# Patient Record
Sex: Female | Born: 1975 | Race: White | Hispanic: No | Marital: Married | Smoking: Never smoker
Health system: Southern US, Community
[De-identification: ages and names within clinical notes are randomized; demographics above are authoritative.]

## PROBLEM LIST (undated history)

## (undated) DIAGNOSIS — T8859XA Other complications of anesthesia, initial encounter: Secondary | ICD-10-CM

## (undated) DIAGNOSIS — F329 Major depressive disorder, single episode, unspecified: Secondary | ICD-10-CM

## (undated) DIAGNOSIS — M87059 Idiopathic aseptic necrosis of unspecified femur: Secondary | ICD-10-CM

## (undated) DIAGNOSIS — F419 Anxiety disorder, unspecified: Secondary | ICD-10-CM

## (undated) DIAGNOSIS — M25552 Pain in left hip: Secondary | ICD-10-CM

## (undated) DIAGNOSIS — E785 Hyperlipidemia, unspecified: Secondary | ICD-10-CM

## (undated) DIAGNOSIS — G43909 Migraine, unspecified, not intractable, without status migrainosus: Secondary | ICD-10-CM

## (undated) DIAGNOSIS — F32A Depression, unspecified: Secondary | ICD-10-CM

## (undated) DIAGNOSIS — G4730 Sleep apnea, unspecified: Secondary | ICD-10-CM

## (undated) DIAGNOSIS — M25551 Pain in right hip: Secondary | ICD-10-CM

## (undated) HISTORY — DX: Sleep apnea, unspecified: G47.30

## (undated) HISTORY — DX: Depression, unspecified: F32.A

## (undated) HISTORY — DX: Major depressive disorder, single episode, unspecified: F32.9

## (undated) HISTORY — DX: Migraine, unspecified, not intractable, without status migrainosus: G43.909

## (undated) HISTORY — DX: Idiopathic aseptic necrosis of unspecified femur: M87.059

## (undated) HISTORY — PX: WISDOM TOOTH EXTRACTION: SHX21

## (undated) HISTORY — DX: Anxiety disorder, unspecified: F41.9

## (undated) HISTORY — DX: Pain in right hip: M25.551

## (undated) HISTORY — DX: Hyperlipidemia, unspecified: E78.5

## (undated) HISTORY — DX: Pain in left hip: M25.552

---

## 2010-11-15 ENCOUNTER — Other Ambulatory Visit: Payer: Self-pay | Admitting: Orthopedic Surgery

## 2010-11-15 DIAGNOSIS — M25552 Pain in left hip: Secondary | ICD-10-CM

## 2010-11-21 ENCOUNTER — Other Ambulatory Visit: Payer: Self-pay

## 2010-11-26 ENCOUNTER — Other Ambulatory Visit: Payer: Self-pay | Admitting: Orthopedic Surgery

## 2010-11-26 ENCOUNTER — Ambulatory Visit
Admission: RE | Admit: 2010-11-26 | Discharge: 2010-11-26 | Disposition: A | Discharge status: Home / Self Care | Payer: BC Managed Care – PPO | Source: Ambulatory Visit | Attending: Orthopedic Surgery | Admitting: Orthopedic Surgery

## 2010-11-26 DIAGNOSIS — M25552 Pain in left hip: Secondary | ICD-10-CM

## 2011-01-24 ENCOUNTER — Encounter: Payer: Self-pay | Admitting: Family Medicine

## 2011-01-24 ENCOUNTER — Ambulatory Visit (INDEPENDENT_AMBULATORY_CARE_PROVIDER_SITE_OTHER): Payer: BC Managed Care – PPO | Admitting: Family Medicine

## 2011-01-24 VITALS — BP 122/70 | HR 109 | Temp 98.9°F | Ht 65.0 in | Wt 280.0 lb

## 2011-01-24 DIAGNOSIS — F329 Major depressive disorder, single episode, unspecified: Secondary | ICD-10-CM

## 2011-01-24 DIAGNOSIS — E785 Hyperlipidemia, unspecified: Secondary | ICD-10-CM

## 2011-01-24 DIAGNOSIS — M25559 Pain in unspecified hip: Secondary | ICD-10-CM

## 2011-01-24 DIAGNOSIS — G4733 Obstructive sleep apnea (adult) (pediatric): Secondary | ICD-10-CM

## 2011-01-24 DIAGNOSIS — F32A Depression, unspecified: Secondary | ICD-10-CM

## 2011-01-24 DIAGNOSIS — E669 Obesity, unspecified: Secondary | ICD-10-CM

## 2011-01-24 DIAGNOSIS — M25551 Pain in right hip: Secondary | ICD-10-CM

## 2011-01-24 MED ORDER — VENLAFAXINE HCL ER 150 MG PO CP24: 150.0000 mg | ORAL_CAPSULE | Freq: Every day | ORAL | Status: DC

## 2011-01-26 ENCOUNTER — Encounter: Payer: Self-pay | Admitting: Family Medicine

## 2011-01-26 NOTE — Progress Notes (Signed)
  Subjective:    Patient ID: Helen Paul, female    DOB: 1976/01/30, 35 y.o.   MRN: [id number]  HPI 8 yr ols female to establish with [place] after moving here from [place], [place] several months ago. She separated from her husband in February, and she moved here to be close to family. Her chief concern today is possible sleep apnea. She asks to be referred for a sleep study. She has snored all her life, and people often comment on how she stops breathing during the night. She has put on around 100 lbs of weight in the past year, and she is always tired and sleepy. Obesity runs in her family, and she watches her diet. Her brother has had lap band surgery and her sister has had a gastric bypass. She is unable to exercise much due to her chronic severe bilateral hiip pain. 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 She is followed by Dr. Madelon Lips, and he has referred her to see Dr. Caswell Corwin at [facility] for possible hip replacements. She will meet him on 02-03-11. She currently works as a Warehouse manager. She had full labs run this past January, with a normal glucose and a normal TSH. Her TG were 239, HDL was 42, and LDL was 105.    Review of Systems  Constitutional: Positive for fatigue.  HENT: Negative.   Eyes: Negative.   Respiratory: Negative.   Cardiovascular: Negative.   Gastrointestinal: Negative.   Musculoskeletal: Positive for arthralgias.  Neurological: Negative.        Objective:   Physical Exam  Constitutional: She is oriented to person, place, and time. She appears well-developed.       obese  Neck: Normal range of motion. Neck supple. No thyromegaly present.  Cardiovascular: Normal rate, regular rhythm, normal heart sounds and intact distal pulses.   Pulmonary/Chest: Effort normal.  Lymphadenopathy:    She has no cervical adenopathy.  Neurological: She is alert and oriented to person, place, and  time. No cranial nerve deficit. She exhibits normal muscle tone. Coordination normal.          Assessment & Plan:  Obviously she needs to lose weight, but exercise [place] difficult for her. She will see Dr. Caswell Corwin about possible hip replacement surgery. Someday bariatric surgery may be appropriate for her. I do think she has obstructive sleep apnea, so we will refer to work this up.

## 2011-02-20 ENCOUNTER — Institutional Professional Consult (permissible substitution): Payer: BC Managed Care – PPO | Admitting: Pulmonary Disease

## 2011-02-21 ENCOUNTER — Ambulatory Visit (INDEPENDENT_AMBULATORY_CARE_PROVIDER_SITE_OTHER): Payer: BC Managed Care – PPO | Admitting: Pulmonary Disease

## 2011-02-21 ENCOUNTER — Encounter: Payer: Self-pay | Admitting: Pulmonary Disease

## 2011-02-21 VITALS — BP 118/70 | HR 80 | Temp 98.3°F | Ht 65.0 in | Wt 274.6 lb

## 2011-02-21 DIAGNOSIS — G4733 Obstructive sleep apnea (adult) (pediatric): Secondary | ICD-10-CM

## 2011-02-21 NOTE — Patient Instructions (Signed)
Will set up for sleep study, and arrange followup once results are available.  

## 2011-02-21 NOTE — Progress Notes (Signed)
  Subjective:    Patient ID: Helen Paul, female    DOB: 04-25-1976, 35 y.o.   MRN: [id number]  HPI The patient is a 35 year old female who I have been asked to see for possible obstructive sleep apnea.  The patient states she has been noted to have loud snoring, but it is unknown if she has an abnormal breathing pattern during sleep.  She denies frequent awakenings during the night, but does not feel rested upon arising.  Although her mother carries a diagnosis of narcolepsy, she has only had issues with this over the last 5 years or so.  She notes significant sleep pressure at work, and is fearful about losing her job.  She also has dozes in the evenings while watching TV or trying to read.  She also admits to sleep pressure with driving longer distances.  Of note, her weight is up about 20 pounds over the last 2 years.  Her Epworth score today is 14.  Sleep Questionnaire: What time do you typically go to bed?( Between what hours) 10:30 to 11:30 pm How long does it take you to fall asleep? usually less than 10 mins How many times during the night do you wake up? 0 What time do you get out of bed to start your day? 0700 Do you drive or operate heavy machinery in your occupation? No How much has your weight changed (up or down) over the past two years? (In pounds) 20 lb (9.072 kg) Have you ever had a sleep study before? No Do you currently use CPAP? No Do you wear oxygen at any time? No    Review of Systems  Constitutional: Negative for fever and unexpected weight change.  HENT: Positive for ear pain and dental problem. Negative for nosebleeds, congestion, sore throat, rhinorrhea, sneezing, trouble swallowing, postnasal drip and sinus pressure.   Eyes: Negative for redness and itching.  Respiratory: Negative for cough, chest tightness, shortness of breath and wheezing.   Cardiovascular: Negative for palpitations and leg swelling.  Gastrointestinal: Negative for nausea and vomiting.  Genitourinary:  Negative for dysuria.  Musculoskeletal: Positive for joint swelling.  Skin: Negative for rash.  Neurological: Positive for headaches.  Hematological: Does not bruise/bleed easily.  Psychiatric/Behavioral: Negative for dysphoric mood. The patient is not nervous/anxious.        Objective:   Physical Exam Constitutional:  Obese female in nad  HENT:  Nares patent without discharge  Oropharynx without exudate, palate mildly elongated, uvula normal, very large tonsils.   Eyes:  Perrla, eomi, no scleral icterus  Neck:  No JVD, no TMG  Cardiovascular:  Normal rate, regular rhythm, no rubs or gallops.  No murmurs        Intact distal pulses  Pulmonary :  Normal breath sounds, no stridor or respiratory distress   No rales, rhonchi, or wheezing  Abdominal:  Soft, nondistended, bowel sounds present.  No tenderness noted.   Musculoskeletal:  No lower extremity edema noted.  Lymph Nodes:  No cervical lymphadenopathy noted  Skin:  No cyanosis noted  Neurologic:  Alert, appropriate, moves all 4 extremities without obvious deficit.         Assessment & Plan:

## 2011-02-23 ENCOUNTER — Encounter: Payer: Self-pay | Admitting: Pulmonary Disease

## 2011-02-23 NOTE — Assessment & Plan Note (Signed)
The patient's history is most consistent with obstructive sleep apnea.  She is obese, has loud snoring, nonrestorative sleep, and significant daytime sleepiness.  I have had a long discussion with the pt about sleep apnea, including its impact on QOL and CV health.  I think at this point that she needs a nocturnal polysomnogram for diagnosis, and patient is agreeable.  If this is unremarkable, she will have to be evaluated for other causes of daytime sleepiness.

## 2011-03-02 ENCOUNTER — Ambulatory Visit (HOSPITAL_BASED_OUTPATIENT_CLINIC_OR_DEPARTMENT_OTHER): Payer: BC Managed Care – PPO | Attending: Pulmonary Disease

## 2011-03-02 DIAGNOSIS — G4733 Obstructive sleep apnea (adult) (pediatric): Secondary | ICD-10-CM | POA: Insufficient documentation

## 2011-03-03 ENCOUNTER — Telehealth: Payer: Self-pay | Admitting: Pulmonary Disease

## 2011-03-03 NOTE — Telephone Encounter (Signed)
Spoke with pt and she states her total hip replacement surgery may be moved up to 9/5 or 9/12 and needs her sleep study read ASAP. Pt states she had this done last night 8/26. I advised pt this could take up to a week to be read but pt states she had already discuss the emergency of this sleep study being read. Pt aware [facility] is not in the office this afternoon. Please advise Dr. Shelle Iron. Thanks  Carver Fila, CMA

## 2011-03-03 NOTE — Telephone Encounter (Signed)
Let her know will look at [facility], and give her a call.

## 2011-03-04 NOTE — Telephone Encounter (Signed)
lmomtcb  

## 2011-03-04 NOTE — Telephone Encounter (Signed)
Pt aware. She also states she wanted it noted her surgery is set for 03-18-11.Carron Curie, CMA

## 2011-03-06 ENCOUNTER — Other Ambulatory Visit: Payer: Self-pay | Admitting: Pulmonary Disease

## 2011-03-06 ENCOUNTER — Telehealth: Payer: Self-pay | Admitting: Pulmonary Disease

## 2011-03-06 DIAGNOSIS — G4733 Obstructive sleep apnea (adult) (pediatric): Secondary | ICD-10-CM

## 2011-03-06 NOTE — Telephone Encounter (Signed)
PT CALLED BACK. "FRUSTRATED" THAT SHE HASN'T YET SPOKEN TO [facility]. THOUGHT SHE WOULD GET RESULTS TODAY. WANTS TO BE CALLED ASAP BY FRI MORNING. Helen Paul

## 2011-03-06 NOTE — Telephone Encounter (Signed)
Helen Paul spoke with pt and is aware of kc response nothing further was needed

## 2011-03-06 NOTE — Procedures (Signed)
NAMESHAMERIA, TRIMARCO              ACCOUNT NO.:  [id number]  MEDICAL RECORD NO.:  [id number]          PATIENT TYPE:  OUT  LOCATION:  SLEEP CENTER                 FACILITY[facility]  [facility]  PHYSICIAN:  Barbaraann Share, MD,FCCPDATE OF BIRTH:  09-12-1975  DATE OF STUDY:  03/02/2011                           NOCTURNAL POLYSOMNOGRAM  REFERRING PHYSICIAN:  Barbaraann Share, MD,FCCP  INDICATION FOR STUDY:  Hypersomnia with sleep apnea.  EPWORTH SLEEPINESS SCORE:  16.  SLEEP ARCHITECTURE:  The patient had a total sleep time of 323 minutes with very little slow wave sleep and only 25 minutes of REM.  Sleep onset latency was normal at 30 minutes, and REM onset was very prolonged at 230 minutes.  Sleep efficiency was mildly reduced at 82%.  RESPIRATORY DATA:  The patient was found to have 9 obstructive apneas, and 125 obstructive hypopneas, giving her an apnea/hypopnea index of 25 events per hour.  The events occurred in all body positions and there was moderate snoring noted throughout.  OXYGEN DATA:  There was O2 desaturation as low as 84% with the patient's obstructive events.  CARDIAC DATA:  No clinically significant arrhythmias were noted.  MOVEMENT-PARASOMNIA:  The patient had no significant leg jerks or other abnormal behaviors.  IMPRESSION-RECOMMENDATIONS:  Moderate obstructive sleep apnea/hypopnea syndrome with an AH I of 25 events per hour, and oxygen desaturation is as low as 84%.  Treatment for this degree of sleep apnea should focus primarily on weight loss as well as CPAP.  A dental appliance could also be considered.     Barbaraann Share, MD,FCCP Diplomate, American Board of Sleep Medicine Electronically Signed    [facility]/MEDQ  D:  03/06/2011 14:50:54  T:  03/06/2011 18:48:03  Job:  [id number]

## 2011-03-06 NOTE — Telephone Encounter (Signed)
PER KATIE- SEND THIS MSG TO DR YOUNG SO THAT HE CAN READ RESULTS AND CALL PT. Helen Paul

## 2011-03-06 NOTE — Telephone Encounter (Signed)
Megan, let pt know she has moderate sleep apnea by her sleep study, and would be best treated with cpap until after her surgery when she hopefully will lose weight.  Have sent order to [facility].

## 2011-03-06 NOTE — Telephone Encounter (Signed)
lmomtcb to advise per [facility] he is going today to read the study

## 2011-03-07 NOTE — Telephone Encounter (Signed)
Order has been faxed to [facility] for cpap set up, pt is aware, Helen Paul is aware.  They were going to set up today, but not convenient for pt.  She will call and speak with DME company to give convenient time for her.  They are aware she will be calling and they said they can setup anytime that is convenient for her, even on weekend or labor day. [name]

## 2011-03-07 NOTE — Telephone Encounter (Signed)
Spoke with pt and she is aware of sleep results and verbalized understanding. Pt wants to know how soon she can get set up with cpap b/c pt surgery is 03/18/11. Order has been sent and I spoke with [facility]) and states she will call pt about the set up of cpap and pt is aware [facility] will call her as well

## 2011-03-11 ENCOUNTER — Encounter (HOSPITAL_BASED_OUTPATIENT_CLINIC_OR_DEPARTMENT_OTHER): Payer: BC Managed Care – PPO

## 2011-03-11 ENCOUNTER — Ambulatory Visit: Payer: BC Managed Care – PPO | Admitting: Pulmonary Disease

## 2011-03-18 HISTORY — PX: JOINT REPLACEMENT: SHX530

## 2011-04-21 ENCOUNTER — Encounter: Payer: Self-pay | Admitting: Family Medicine

## 2011-04-21 ENCOUNTER — Ambulatory Visit (INDEPENDENT_AMBULATORY_CARE_PROVIDER_SITE_OTHER): Payer: BC Managed Care – PPO | Admitting: Family Medicine

## 2011-04-21 VITALS — BP 112/70 | HR 119 | Temp 99.2°F | Wt 265.0 lb

## 2011-04-21 DIAGNOSIS — G4700 Insomnia, unspecified: Secondary | ICD-10-CM

## 2011-04-21 DIAGNOSIS — M25559 Pain in unspecified hip: Secondary | ICD-10-CM

## 2011-04-21 DIAGNOSIS — G4730 Sleep apnea, unspecified: Secondary | ICD-10-CM

## 2011-04-21 MED ORDER — TEMAZEPAM 30 MG PO CAPS: 30.0000 mg | ORAL_CAPSULE | Freq: Every evening | ORAL | Status: DC | PRN

## 2011-04-21 NOTE — Progress Notes (Signed)
  Subjective:    Patient ID: Helen Paul, female    DOB: [birth year], 35 y.o.   MRN: [id number]  HPI Here for help with sleep. Ever since her hip surgery last month she has had trouble falling and staying asleep. She used Temazepam successfully in the past. She can' t relax and can't stop thinking about things. Her surgery went well, and she be going back to work next week.    Review of Systems  Constitutional: Negative.   Musculoskeletal: Positive for arthralgias.  Psychiatric/Behavioral: Positive for sleep disturbance. Negative for dysphoric mood and decreased concentration. The patient is nervous/anxious.        Objective:   Physical Exam  Constitutional: She is oriented to person, place, and time. She appears well-developed and well-nourished.  Neurological: She is alert and oriented to person, place, and time.  Psychiatric: She has a normal mood and affect. Her behavior is normal. Thought content normal.          Assessment & Plan:  Try Temazepam again.

## 2011-05-07 ENCOUNTER — Ambulatory Visit (INDEPENDENT_AMBULATORY_CARE_PROVIDER_SITE_OTHER): Payer: BC Managed Care – PPO

## 2011-05-07 DIAGNOSIS — Z23 Encounter for immunization: Secondary | ICD-10-CM

## 2011-09-12 ENCOUNTER — Ambulatory Visit (INDEPENDENT_AMBULATORY_CARE_PROVIDER_SITE_OTHER): Payer: BC Managed Care – PPO | Admitting: Family Medicine

## 2011-09-12 ENCOUNTER — Encounter: Payer: Self-pay | Admitting: Family Medicine

## 2011-09-12 VITALS — BP 108/74 | HR 95 | Temp 99.1°F | Wt 264.0 lb

## 2011-09-12 DIAGNOSIS — M722 Plantar fascial fibromatosis: Secondary | ICD-10-CM

## 2011-09-12 DIAGNOSIS — G4700 Insomnia, unspecified: Secondary | ICD-10-CM

## 2011-09-12 MED ORDER — TEMAZEPAM 30 MG PO CAPS: 30.0000 mg | ORAL_CAPSULE | Freq: Every evening | ORAL | Status: DC | PRN

## 2011-09-12 NOTE — Progress Notes (Signed)
  Subjective:    Patient ID: Helen Paul, female    DOB: 1975-11-18, 36 y.o.   MRN: [id number]  HPI Here for 2 months of left heel pain. No trauma hx. She started doing Zumba classes recently to lose weight.    Review of Systems  Constitutional: Negative.        Objective:   Physical Exam  Constitutional: She appears well-developed and well-nourished.  Musculoskeletal:       Tender over the bottom of the left heel. No redness or swelling.           Assessment & Plan:  Use Motrin for pain. Wear gel arch supports and good supportive shoes at all times. Recheck prn

## 2011-10-06 ENCOUNTER — Encounter: Payer: Self-pay | Admitting: Family

## 2011-10-06 ENCOUNTER — Ambulatory Visit (INDEPENDENT_AMBULATORY_CARE_PROVIDER_SITE_OTHER): Payer: BC Managed Care – PPO | Admitting: Family

## 2011-10-06 VITALS — BP 120/76 | Temp 98.8°F | Wt 269.0 lb

## 2011-10-06 DIAGNOSIS — R05 Cough: Secondary | ICD-10-CM

## 2011-10-06 DIAGNOSIS — R059 Cough, unspecified: Secondary | ICD-10-CM

## 2011-10-06 DIAGNOSIS — J209 Acute bronchitis, unspecified: Secondary | ICD-10-CM

## 2011-10-06 MED ORDER — METHYLPREDNISOLONE 4 MG PO KIT: PACK | ORAL | Status: DC

## 2011-10-06 MED ORDER — METHYLPREDNISOLONE 4 MG PO KIT: PACK | ORAL | Status: AC

## 2011-10-06 MED ORDER — AMOXICILLIN 500 MG PO TABS: 1000.0000 mg | ORAL_TABLET | Freq: Two times a day (BID) | ORAL | Status: AC

## 2011-10-06 MED ORDER — AMOXICILLIN 500 MG PO TABS: 1000.0000 mg | ORAL_TABLET | Freq: Two times a day (BID) | ORAL | Status: DC

## 2011-10-06 NOTE — Patient Instructions (Signed)

## 2011-10-06 NOTE — Progress Notes (Signed)
Addended by: Beverely Low on: 10/06/2011 04:09 PM   Modules accepted: Orders

## 2011-10-06 NOTE — Progress Notes (Signed)
Subjective:    Patient ID: Helen Paul, female    DOB: 1975-11-23, 36 y.o.   MRN: [id number]  HPI Comments: C/o sinus drainage, chills, sorethroat, hacking cough, dyspnea with cough, and nasal drainage x 5 days. Took OTC theraflu with no relief.   Sinusitis Associated symptoms include chills, congestion, ear pain, shortness of breath and sinus pressure. Pertinent negatives include no diaphoresis or sneezing.      Review of Systems  Constitutional: Positive for fever and chills. Negative for diaphoresis, activity change and fatigue.  HENT: Positive for ear pain, congestion, rhinorrhea, postnasal drip and sinus pressure. Negative for hearing loss, facial swelling, sneezing, neck stiffness and ear discharge.   Respiratory: Positive for shortness of breath. Negative for apnea, choking and chest tightness.   Cardiovascular: Negative.    Past Medical History  Diagnosis Date  . Depression   . Migraines   . Hyperlipidemia   . Arthritis   . Hip pain, bilateral     sees Dr. Madelon Lips  . Avascular necrosis of hip   . Sleep apnea     per Dr. Shelle Iron, uses CPAP     History   Social History  . Marital Status: Single    Spouse Name: N/A    Number of Children: N/A  . Years of Education: N/A   Occupational History  . Not on file.   Social History Main Topics  . Smoking status: Never Smoker   . Smokeless tobacco: Never Used  . Alcohol Use: No  . Drug Use: No  . Sexually Active: Not on file   Other Topics Concern  . Not on file   Social History Narrative  . No narrative on file    Past Surgical History  Procedure Date  . Wisdom tooth extraction   . Joint replacement 03-18-11    left total hip, per Dr. Nancy Fetter at [facility] History  Problem Relation Age of Onset  . Fibromyalgia Mother   . Heart disease Father   . Hypertension Father   . Diabetes Father   . Heart failure Father   . Chronic fatigue Father   . Narcolepsy Father   . Obesity Sister   .  Obesity Brother     Allergies  Allergen Reactions  . Hydrocodone   . Zithromax (Azithromycin Dihydrate)     Current Outpatient Prescriptions on File Prior to Visit  Medication Sig Dispense Refill  . Multiple Vitamin (MULTIVITAMIN) tablet Take 1 tablet by mouth daily.      . temazepam (RESTORIL) 30 MG capsule Take 1 capsule (30 mg total) by mouth at bedtime as needed for sleep.  30 capsule  5  . venlafaxine (EFFEXOR-XR) 150 MG 24 hr capsule Take 1 capsule (150 mg total) by mouth daily.  90 capsule  3    BP 120/76  Temp(Src) 98.8 F (37.1 C) (Oral)  Wt 269 lb (122.018 kg)chart    Objective:   Physical Exam  Constitutional: She is oriented to person, place, and time. She appears well-developed and well-nourished. No distress.  HENT:  Right Ear: External ear normal.  Left Ear: External ear normal.  Mouth/Throat: Oropharyngeal exudate present.  Cardiovascular: Normal rate, regular rhythm, normal heart sounds and intact distal pulses.  Exam reveals no gallop and no friction rub.   No murmur heard. Pulmonary/Chest: Effort normal and breath sounds normal. No respiratory distress. She has no wheezes. She has no rales. She exhibits no tenderness.  Neurological: She is alert and oriented to  person, place, and time.  Skin: Skin is warm and dry. She is not diaphoretic.          Assessment & Plan:  Assessment: Bronchitis Plan: Medrol dose pack, amoxicillin, teaching handouts provided on diagnosis and treatment, encouraged to RTC if s/s get worse

## 2012-02-14 ENCOUNTER — Other Ambulatory Visit: Payer: Self-pay | Admitting: Family Medicine

## 2012-02-17 ENCOUNTER — Encounter: Payer: Self-pay | Admitting: Family Medicine

## 2012-02-17 ENCOUNTER — Ambulatory Visit (INDEPENDENT_AMBULATORY_CARE_PROVIDER_SITE_OTHER): Payer: BC Managed Care – PPO | Admitting: Family Medicine

## 2012-02-17 VITALS — BP 140/90 | Temp 98.6°F | Wt 262.0 lb

## 2012-02-17 DIAGNOSIS — F418 Other specified anxiety disorders: Secondary | ICD-10-CM

## 2012-02-17 DIAGNOSIS — F341 Dysthymic disorder: Secondary | ICD-10-CM

## 2012-02-17 MED ORDER — LAMOTRIGINE 25 MG PO TABS: 50.0000 mg | ORAL_TABLET | Freq: Every day | ORAL | Status: DC

## 2012-02-17 MED ORDER — ALPRAZOLAM 1 MG PO TABS: 1.0000 mg | ORAL_TABLET | Freq: Two times a day (BID) | ORAL | Status: AC | PRN

## 2012-02-17 NOTE — Progress Notes (Signed)
  Subjective:    Patient ID: Helen Paul, female    DOB: 10/13/1975, 36 y.o.   MRN: [id number]  HPI Here to discuss anxiety and depression. She has been on Effexor for about 5 years and has been satisfied for the most part. However she has had a difficult 6 months. Her divorce became final in March, but she has still been in contact with her ex-husband. He even asked her about getting back together a month ago and they talked or texted frequently. He had proposed a meeting but then backed out suddenly and now will not return her calls. She is quite depressed now and cries frequently. She gets very anxious and can't focus on anything. It is hard to sleep. She denies any suicidal thoughts.    Review of Systems  Constitutional: Negative.   Psychiatric/Behavioral: Positive for disturbed wake/sleep cycle, dysphoric mood, decreased concentration and agitation. Negative for suicidal ideas, hallucinations, behavioral problems, confusion and self-injury. The patient is nervous/anxious.        Objective:   Physical Exam  Constitutional: She appears well-developed and well-nourished.  Psychiatric: Her behavior is normal. Thought content normal.       Anxious, tearful          Assessment & Plan:  Continue Effexor. Add Lamictal 50 mg a day and Xanax to use prn. Recheck in 2 week s

## 2012-02-24 ENCOUNTER — Ambulatory Visit (INDEPENDENT_AMBULATORY_CARE_PROVIDER_SITE_OTHER): Payer: BC Managed Care – PPO | Admitting: Licensed Clinical Social Worker

## 2012-02-24 DIAGNOSIS — F411 Generalized anxiety disorder: Secondary | ICD-10-CM

## 2012-02-24 DIAGNOSIS — F331 Major depressive disorder, recurrent, moderate: Secondary | ICD-10-CM

## 2012-03-02 ENCOUNTER — Ambulatory Visit: Payer: BC Managed Care – PPO | Admitting: Family Medicine

## 2012-03-04 ENCOUNTER — Ambulatory Visit (INDEPENDENT_AMBULATORY_CARE_PROVIDER_SITE_OTHER): Payer: BC Managed Care – PPO | Admitting: Licensed Clinical Social Worker

## 2012-03-04 DIAGNOSIS — F411 Generalized anxiety disorder: Secondary | ICD-10-CM

## 2012-03-04 DIAGNOSIS — F331 Major depressive disorder, recurrent, moderate: Secondary | ICD-10-CM

## 2012-03-05 ENCOUNTER — Ambulatory Visit: Payer: BC Managed Care – PPO | Admitting: Licensed Clinical Social Worker

## 2012-03-10 ENCOUNTER — Encounter: Payer: Self-pay | Admitting: Family Medicine

## 2012-03-10 ENCOUNTER — Ambulatory Visit (INDEPENDENT_AMBULATORY_CARE_PROVIDER_SITE_OTHER): Payer: BC Managed Care – PPO | Admitting: Family Medicine

## 2012-03-10 VITALS — BP 130/84 | HR 90 | Temp 98.9°F | Wt 260.0 lb

## 2012-03-10 DIAGNOSIS — F341 Dysthymic disorder: Secondary | ICD-10-CM

## 2012-03-10 DIAGNOSIS — F418 Other specified anxiety disorders: Secondary | ICD-10-CM

## 2012-03-10 MED ORDER — LAMOTRIGINE 100 MG PO TABS: 100.0000 mg | ORAL_TABLET | Freq: Every day | ORAL | Status: DC

## 2012-03-10 NOTE — Progress Notes (Signed)
  Subjective:    Patient ID: Helen Paul, female    DOB: February 15, 1976, 36 y.o.   MRN: [id number]  HPI Here to follow up on anxiety and OCD traits. She is still trying to work things out with her ex-husband but this is not going well. She is pleased with her sessions with Judithe Modest and finds this very helpful. She thinks the Lamictal is helping but it could work a little better. No side effects. She is working out at [employer] 4 days a week.   Review of Systems  Constitutional: Negative.   Psychiatric/Behavioral: Positive for dysphoric mood and decreased concentration. Negative for behavioral problems, confusion and agitation. The patient is nervous/anxious.        Objective:   Physical Exam  Constitutional: She is oriented to person, place, and time. She appears well-developed and well-nourished.  Neurological: She is alert and oriented to person, place, and time.  Psychiatric: She has a normal mood and affect. Her behavior is normal. Thought content normal.          Assessment & Plan:  She is clearly doing better, but we will increase the Lamictal to 100 mg a day. Continue with therapy. Recheck one month.

## 2012-03-12 ENCOUNTER — Ambulatory Visit: Payer: BC Managed Care – PPO | Admitting: Licensed Clinical Social Worker

## 2012-03-15 ENCOUNTER — Ambulatory Visit (INDEPENDENT_AMBULATORY_CARE_PROVIDER_SITE_OTHER): Payer: BC Managed Care – PPO | Admitting: Licensed Clinical Social Worker

## 2012-03-15 DIAGNOSIS — F331 Major depressive disorder, recurrent, moderate: Secondary | ICD-10-CM

## 2012-03-15 DIAGNOSIS — F411 Generalized anxiety disorder: Secondary | ICD-10-CM

## 2012-03-19 ENCOUNTER — Ambulatory Visit: Payer: BC Managed Care – PPO | Admitting: Licensed Clinical Social Worker

## 2012-03-22 ENCOUNTER — Ambulatory Visit (INDEPENDENT_AMBULATORY_CARE_PROVIDER_SITE_OTHER): Payer: BC Managed Care – PPO | Admitting: Licensed Clinical Social Worker

## 2012-03-22 DIAGNOSIS — F411 Generalized anxiety disorder: Secondary | ICD-10-CM

## 2012-03-22 DIAGNOSIS — F331 Major depressive disorder, recurrent, moderate: Secondary | ICD-10-CM

## 2012-03-24 ENCOUNTER — Telehealth: Payer: Self-pay | Admitting: Family Medicine

## 2012-03-24 NOTE — Telephone Encounter (Signed)
Refill request for Temazepam 30 mg take 1 po qhs prn and last here on 03/10/12. 

## 2012-03-24 NOTE — Telephone Encounter (Signed)
Call in #30 with 5 rf 

## 2012-03-26 ENCOUNTER — Telehealth: Payer: Self-pay | Admitting: Family Medicine

## 2012-03-26 MED ORDER — TEMAZEPAM 30 MG PO CAPS: 30.0000 mg | ORAL_CAPSULE | Freq: Every evening | ORAL | Status: DC | PRN

## 2012-03-26 NOTE — Telephone Encounter (Signed)
I called in script 

## 2012-03-26 NOTE — Telephone Encounter (Signed)
Refill request for Temazepam 30 mg take 1 po qhs prn and last here on 03/10/12.

## 2012-03-26 NOTE — Telephone Encounter (Signed)
duplicate

## 2012-03-29 ENCOUNTER — Ambulatory Visit (INDEPENDENT_AMBULATORY_CARE_PROVIDER_SITE_OTHER): Payer: BC Managed Care – PPO | Admitting: Licensed Clinical Social Worker

## 2012-03-29 DIAGNOSIS — F411 Generalized anxiety disorder: Secondary | ICD-10-CM

## 2012-03-29 DIAGNOSIS — F331 Major depressive disorder, recurrent, moderate: Secondary | ICD-10-CM

## 2012-04-05 ENCOUNTER — Ambulatory Visit (INDEPENDENT_AMBULATORY_CARE_PROVIDER_SITE_OTHER): Payer: BC Managed Care – PPO | Admitting: Licensed Clinical Social Worker

## 2012-04-05 DIAGNOSIS — F331 Major depressive disorder, recurrent, moderate: Secondary | ICD-10-CM

## 2012-04-05 DIAGNOSIS — F411 Generalized anxiety disorder: Secondary | ICD-10-CM

## 2012-04-13 ENCOUNTER — Ambulatory Visit (INDEPENDENT_AMBULATORY_CARE_PROVIDER_SITE_OTHER): Payer: BC Managed Care – PPO | Admitting: Licensed Clinical Social Worker

## 2012-04-13 DIAGNOSIS — F331 Major depressive disorder, recurrent, moderate: Secondary | ICD-10-CM

## 2012-04-13 DIAGNOSIS — F411 Generalized anxiety disorder: Secondary | ICD-10-CM

## 2012-04-26 ENCOUNTER — Ambulatory Visit (INDEPENDENT_AMBULATORY_CARE_PROVIDER_SITE_OTHER): Payer: BC Managed Care – PPO | Admitting: Licensed Clinical Social Worker

## 2012-04-26 DIAGNOSIS — F331 Major depressive disorder, recurrent, moderate: Secondary | ICD-10-CM

## 2012-04-26 DIAGNOSIS — F411 Generalized anxiety disorder: Secondary | ICD-10-CM

## 2012-05-10 ENCOUNTER — Ambulatory Visit (INDEPENDENT_AMBULATORY_CARE_PROVIDER_SITE_OTHER): Payer: BC Managed Care – PPO | Admitting: Licensed Clinical Social Worker

## 2012-05-10 DIAGNOSIS — F411 Generalized anxiety disorder: Secondary | ICD-10-CM

## 2012-05-10 DIAGNOSIS — F331 Major depressive disorder, recurrent, moderate: Secondary | ICD-10-CM

## 2012-05-11 ENCOUNTER — Other Ambulatory Visit: Payer: Self-pay | Admitting: Family Medicine

## 2012-05-11 NOTE — Telephone Encounter (Signed)
Call in #60 with 2 rf 

## 2012-05-24 ENCOUNTER — Ambulatory Visit: Payer: BC Managed Care – PPO | Admitting: Licensed Clinical Social Worker

## 2012-06-11 ENCOUNTER — Other Ambulatory Visit: Payer: Self-pay | Admitting: Family Medicine

## 2012-08-04 ENCOUNTER — Telehealth: Payer: Self-pay | Admitting: Family Medicine

## 2012-08-04 MED ORDER — VENLAFAXINE HCL ER 150 MG PO CP24: 150.0000 mg | ORAL_CAPSULE | Freq: Every day | ORAL | Status: DC

## 2012-08-04 NOTE — Telephone Encounter (Signed)
Call in #30 as above

## 2012-08-04 NOTE — Telephone Encounter (Signed)
Patient called stating that she need a 30 day refill of her efexxor xr 150mg  1poqd called into [facility] [address] until she can get her mail order rx. Please assist.

## 2012-08-04 NOTE — Telephone Encounter (Signed)
Rx sent to pharmacy   

## 2012-08-12 ENCOUNTER — Telehealth: Payer: Self-pay | Admitting: Family Medicine

## 2012-08-12 ENCOUNTER — Ambulatory Visit (INDEPENDENT_AMBULATORY_CARE_PROVIDER_SITE_OTHER): Payer: BC Managed Care – PPO | Admitting: Licensed Clinical Social Worker

## 2012-08-12 DIAGNOSIS — F331 Major depressive disorder, recurrent, moderate: Secondary | ICD-10-CM

## 2012-08-12 DIAGNOSIS — F411 Generalized anxiety disorder: Secondary | ICD-10-CM

## 2012-08-12 NOTE — Telephone Encounter (Signed)
Pt needs a refill of 90 day Effexor XR 150 [facility], also if you can do Lamotrigine in a 90 day supply as well, pt is not sure if this is possible, also [facility]

## 2012-08-13 NOTE — Telephone Encounter (Signed)
Call in 90 day supplies of both with 3 rf

## 2012-08-16 MED ORDER — LAMOTRIGINE 100 MG PO TABS: 100.0000 mg | ORAL_TABLET | Freq: Every day | ORAL | Status: DC

## 2012-08-16 MED ORDER — VENLAFAXINE HCL ER 150 MG PO CP24: 150.0000 mg | ORAL_CAPSULE | Freq: Every day | ORAL | Status: DC

## 2012-08-16 NOTE — Telephone Encounter (Signed)
I sent both scripts e-scribe and tried to reach pt, no answer.

## 2012-08-31 ENCOUNTER — Other Ambulatory Visit: Payer: BC Managed Care – PPO

## 2012-09-01 ENCOUNTER — Other Ambulatory Visit (INDEPENDENT_AMBULATORY_CARE_PROVIDER_SITE_OTHER): Payer: BC Managed Care – PPO

## 2012-09-01 DIAGNOSIS — Z0000 Encounter for general adult medical examination without abnormal findings: Secondary | ICD-10-CM

## 2012-09-01 LAB — CBC WITH DIFFERENTIAL/PLATELET
Basophils Absolute: 0 10*3/uL (ref 0.0–0.1)
Basophils Relative: 0.4 % (ref 0.0–3.0)
Eosinophils Absolute: 0.1 10*3/uL (ref 0.0–0.7)
Eosinophils Relative: 1.4 % (ref 0.0–5.0)
HCT: 37.2 % (ref 36.0–46.0)
Hemoglobin: 12.5 g/dL (ref 12.0–15.0)
Lymphocytes Relative: 28 % (ref 12.0–46.0)
Lymphs Abs: 2 10*3/uL (ref 0.7–4.0)
MCHC: 33.7 g/dL (ref 30.0–36.0)
MCV: 84.2 fl (ref 78.0–100.0)
Monocytes Absolute: 0.4 10*3/uL (ref 0.1–1.0)
Monocytes Relative: 5.3 % (ref 3.0–12.0)
Neutro Abs: 4.6 10*3/uL (ref 1.4–7.7)
Neutrophils Relative %: 64.9 % (ref 43.0–77.0)
Platelets: 350 10*3/uL (ref 150.0–400.0)
RBC: 4.42 Mil/uL (ref 3.87–5.11)
RDW: 14.8 % — ABNORMAL HIGH (ref 11.5–14.6)
WBC: 7 10*3/uL (ref 4.5–10.5)

## 2012-09-01 LAB — POCT URINALYSIS DIPSTICK
Blood, UA: NEGATIVE
Glucose, UA: NEGATIVE
Leukocytes, UA: NEGATIVE
Nitrite, UA: NEGATIVE
Spec Grav, UA: >=1.03
Urobilinogen, UA: 0.2
pH, UA: 5.5

## 2012-09-01 LAB — HEPATIC FUNCTION PANEL
ALT: 24 U/L (ref 0–35)
AST: 25 U/L (ref 0–37)
Albumin: 3.9 g/dL (ref 3.5–5.2)
Alkaline Phosphatase: 79 U/L (ref 39–117)
Bilirubin, Direct: 0 mg/dL (ref 0.0–0.3)
Total Bilirubin: 0.4 mg/dL (ref 0.3–1.2)
Total Protein: 7.1 g/dL (ref 6.0–8.3)

## 2012-09-01 LAB — LIPID PANEL
Cholesterol: 222 mg/dL — ABNORMAL HIGH (ref 0–200)
HDL: 44.9 mg/dL (ref >39.00)
Total CHOL/HDL Ratio: 5
Triglycerides: 149 mg/dL (ref 0.0–149.0)
VLDL: 29.8 mg/dL (ref 0.0–40.0)

## 2012-09-01 LAB — BASIC METABOLIC PANEL
BUN: 15 mg/dL (ref 6–23)
CO2: 27 mEq/L (ref 19–32)
Calcium: 9.2 mg/dL (ref 8.4–10.5)
Chloride: 102 mEq/L (ref 96–112)
Creatinine, Ser: 0.8 mg/dL (ref 0.4–1.2)
GFR: 82.25 mL/min (ref >60.00)
Glucose, Bld: 89 mg/dL (ref 70–99)
Potassium: 3.9 mEq/L (ref 3.5–5.1)
Sodium: 138 mEq/L (ref 135–145)

## 2012-09-01 LAB — LDL CHOLESTEROL, DIRECT: Direct LDL: 168.3 mg/dL

## 2012-09-01 LAB — TSH: TSH: 1.73 u[IU]/mL (ref 0.35–5.50)

## 2012-09-02 NOTE — Progress Notes (Signed)
Quick Note:  Pt has CPE on 09/07/12 will go over then. ______ 

## 2012-09-04 ENCOUNTER — Other Ambulatory Visit: Payer: Self-pay | Admitting: Family Medicine

## 2012-09-07 ENCOUNTER — Ambulatory Visit (INDEPENDENT_AMBULATORY_CARE_PROVIDER_SITE_OTHER): Payer: BC Managed Care – PPO | Admitting: Family Medicine

## 2012-09-07 ENCOUNTER — Encounter: Payer: Self-pay | Admitting: Family Medicine

## 2012-09-07 VITALS — BP 128/78 | HR 80 | Temp 98.7°F | Ht 65.5 in | Wt 250.0 lb

## 2012-09-07 DIAGNOSIS — F32A Depression, unspecified: Secondary | ICD-10-CM

## 2012-09-07 DIAGNOSIS — F329 Major depressive disorder, single episode, unspecified: Secondary | ICD-10-CM

## 2012-09-07 DIAGNOSIS — Z0000 Encounter for general adult medical examination without abnormal findings: Secondary | ICD-10-CM

## 2012-09-07 MED ORDER — TEMAZEPAM 30 MG PO CAPS: 30.0000 mg | ORAL_CAPSULE | Freq: Every evening | ORAL | Status: DC | PRN

## 2012-09-07 NOTE — Progress Notes (Signed)
  Subjective:    Patient ID: Helen Paul, female    DOB: 26-Jan-1976, 37 y.o.   MRN: [id number]  HPI 37 yr old female for a cpx. She is doing well and has no concerns. She had been involved with [employer] and was exercising last fall and she lost 29 lbs. Over the winter she has stopped some of this with her weight being stable.    Review of Systems  Constitutional: Negative.   HENT: Negative.   Eyes: Negative.   Respiratory: Negative.   Cardiovascular: Negative.   Gastrointestinal: Negative.   Genitourinary: Negative for dysuria, urgency, frequency, hematuria, flank pain, decreased urine volume, enuresis, difficulty urinating, pelvic pain and dyspareunia.  Musculoskeletal: Negative.   Skin: Negative.   Neurological: Negative.   Psychiatric/Behavioral: Negative.        Objective:   Physical Exam  Constitutional: She is oriented to person, place, and time. She appears well-developed and well-nourished. No distress.  HENT:  Head: Normocephalic and atraumatic.  Right Ear: External ear normal.  Left Ear: External ear normal.  Nose: Nose normal.  Mouth/Throat: Oropharynx is clear and moist. No oropharyngeal exudate.  Eyes: Conjunctivae and EOM are normal. Pupils are equal, round, and reactive to light. No scleral icterus.  Neck: Normal range of motion. Neck supple. No JVD present. No thyromegaly present.  Cardiovascular: Normal rate, regular rhythm, normal heart sounds and intact distal pulses.  Exam reveals no gallop and no friction rub.   No murmur heard. Pulmonary/Chest: Effort normal and breath sounds normal. No respiratory distress. She has no wheezes. She has no rales. She exhibits no tenderness.  Abdominal: Soft. Bowel sounds are normal. She exhibits no distension and no mass. There is no tenderness. There is no rebound and no guarding.  Musculoskeletal: Normal range of motion. She exhibits no edema and no tenderness.  Lymphadenopathy:    She has no cervical adenopathy.   Neurological: She is alert and oriented to person, place, and time. She has normal reflexes. No cranial nerve deficit. She exhibits normal muscle tone. Coordination normal.  Skin: Skin is warm and dry. No rash noted. No erythema.  Psychiatric: She has a normal mood and affect. Her behavior is normal. Judgment and thought content normal.          Assessment & Plan:  Well exam. We discussed getting back into Weight [employer] and her exercise program. Advised her to get her first screening mammogram and to see her GYN for a exam.

## 2012-09-27 ENCOUNTER — Other Ambulatory Visit: Payer: Self-pay | Admitting: Family Medicine

## 2012-09-27 NOTE — Telephone Encounter (Signed)
No, we did this 3 weeks ago

## 2012-11-26 ENCOUNTER — Telehealth: Payer: Self-pay | Admitting: Family Medicine

## 2012-11-26 ENCOUNTER — Ambulatory Visit (INDEPENDENT_AMBULATORY_CARE_PROVIDER_SITE_OTHER)
Admission: RE | Admit: 2012-11-26 | Discharge: 2012-11-26 | Disposition: A | Discharge status: Home / Self Care | Payer: BC Managed Care – PPO | Source: Ambulatory Visit | Attending: Family Medicine | Admitting: Family Medicine

## 2012-11-26 ENCOUNTER — Encounter: Payer: Self-pay | Admitting: Family Medicine

## 2012-11-26 ENCOUNTER — Ambulatory Visit (INDEPENDENT_AMBULATORY_CARE_PROVIDER_SITE_OTHER): Payer: BC Managed Care – PPO | Admitting: Family Medicine

## 2012-11-26 VITALS — BP 120/80 | HR 104 | Temp 99.5°F | Wt 253.0 lb

## 2012-11-26 DIAGNOSIS — M25551 Pain in right hip: Secondary | ICD-10-CM

## 2012-11-26 DIAGNOSIS — M25559 Pain in unspecified hip: Secondary | ICD-10-CM

## 2012-11-26 NOTE — Progress Notes (Signed)
Quick Note:  I left voice message with results. ______ 

## 2012-11-26 NOTE — Telephone Encounter (Signed)
I spoke with pt and she will schedule the visit.

## 2012-11-26 NOTE — Progress Notes (Signed)
  Subjective:    Patient ID: Helen Paul, female    DOB: 05/12/1976, 37 y.o.   MRN: [id number]  HPI Here for several weeks of mild pain in the right hip. Nothing severe, but she says it "doen't feel right". She asks if we can get an Xray to compare to old films. She has known AVN in both hips and it S/P total hip replacement on the left.    Review of Systems  Constitutional: Negative.   Musculoskeletal: Positive for arthralgias.       Objective:   Physical Exam  Constitutional: She appears well-developed and well-nourished.  Musculoskeletal:  Right hip exam is normal with no tenderness and full ROM           Assessment & Plan:  Early AVN of the hip. Get an Xray today.

## 2012-11-26 NOTE — Telephone Encounter (Signed)
Per Dr. Clent Ridges, pt must schedule a office visit to evaluate further.

## 2012-11-26 NOTE — Telephone Encounter (Signed)
Patient Information:  Caller Name: Raiyah  Phone: [phone]  Patient: Helen Paul, Helen Paul  Gender: Female  DOB: Mar 30, 1976  Age: 37 Years  PCP: Gershon Crane [facility])  Pregnant: No  Office Follow Up:  Does the office need to follow up with this patient?: Yes  Instructions For The Office: Caller wants to Dr. Clent Ridges to review and see if XR is needed first d/t pt's history.  RN Note:  Appt declined.  Caller wanted Dr. Clent Ridges to review.    Symptoms  Reason For Call & Symptoms: Pt wants an order to get an XR of her right hip.  Pt has an  Increase awareness of her Right hip.  She says something not quite right.  No known injury.  Pt isn't  inhibited with her activity, no limping. The hip feels as though it is bruised on the inside.   Reviewed Health History In EMR: Yes  Reviewed Medications In EMR: Yes  Reviewed Allergies In EMR: Yes  Reviewed Surgeries / Procedures: Yes  Date of Onset of Symptoms: Unknown OB / GYN:  LMP: 11/16/2012  Guideline(s) Used:  Hip Injury  Leg Pain  Disposition Per Guideline:   See Within 2 Weeks in Office  Reason For Disposition Reached:   Mild pain persists > 7 days  Advice Given:  Call Back If:  Moderate pain (e.g., limping) lasts more than 3 days  Signs of infection occur (e.g., spreading redness, warmth, fever)  You become worse.  Patient Will Follow Care Advice:  YES

## 2013-01-11 ENCOUNTER — Encounter: Payer: Self-pay | Admitting: Family Medicine

## 2013-01-20 ENCOUNTER — Encounter (INDEPENDENT_AMBULATORY_CARE_PROVIDER_SITE_OTHER): Payer: Self-pay | Admitting: Surgery

## 2013-01-20 ENCOUNTER — Ambulatory Visit (INDEPENDENT_AMBULATORY_CARE_PROVIDER_SITE_OTHER): Payer: BC Managed Care – PPO | Admitting: Surgery

## 2013-01-20 DIAGNOSIS — E785 Hyperlipidemia, unspecified: Secondary | ICD-10-CM

## 2013-01-20 DIAGNOSIS — G4733 Obstructive sleep apnea (adult) (pediatric): Secondary | ICD-10-CM

## 2013-01-20 DIAGNOSIS — Z6841 Body Mass Index (BMI) 40.0 and over, adult: Secondary | ICD-10-CM

## 2013-01-20 NOTE — Progress Notes (Signed)
Re:   ULLA MCKIERNAN DOB:   1975-11-02 MRN:   [id number]  ASSESSMENT AND PLAN: 1.  Morbid obesity, Initial weight - 263, BMI - 43.8  Per the 1991 NIH Consensus Statement, the patient is a candidate for bariatric surgery.  The patient attended our information session and reviewed the different types of bariatric surgery.    The patient is interested in the laparoscopic adjustable gastric band.  I discussed with the patient the indications and risks of lap band surgery.  The potential risks of surgery include, but are not limited to, bleeding, infection, DVT and PE, slippage and erosion of the band, open surgery, and death.  The patient understands the importance of compliance and long term follow-up with our group after surgery.  She was given literature regarding lap band surgery.  From here we'll obtain labs, x-rays, nutrition consult, and psych consult.  She is interested in Lap Band because of the safety. Her brother has also done well - though his weight has gone up and down.  2.  History of Depression 3.  History of migraines 4.  Hyperlipidemia  Cholesterol - 222 - 09/01/2012 5.  History of avascular necrosis of the hip 6.  Sleep apnea,  Uses CPAP  No chief complaint on file.  REFERRING PHYSICIAN: Nelwyn Salisbury, MD  HISTORY OF PRESENT ILLNESS: BRAILYN KILLION is a 37 y.o. (DOB: 1975/10/02)  whtie  female whose primary care physician is Nelwyn Salisbury, MD and comes to me today for weight loss surgery.  She came to an information session Dr. Ovidio Kin. She has tried multiple attempts at losing weight. She has tried Weight Watchers at least 2 or 3 times, [employer], and [employer].  She actually blames Effexor for her weight gain. She has been using the Effexor for about 8 years and she thinks much for weight gain correlates with the Effexor. The Effexor has controlled her anxiety so well that she is very hesitant to try change medications.  Her sister had a bypass in [place]  (Dr. Chauncy Passy) and her brother had a lap band in [place].  So she has some family members that she can bounce her thoughts off of.   Past Medical History  Diagnosis Date  . Depression   . Migraines   . Hyperlipidemia   . Arthritis   . Hip pain, bilateral   . Avascular necrosis of hip   . Sleep apnea     per Dr. Shelle Iron, uses CPAP     Past Surgical History  Procedure Laterality Date  . Wisdom tooth extraction    . Joint replacement  03-18-11    left total hip, [facility]     Current Outpatient Prescriptions  Medication Sig Dispense Refill  . ALPRAZolam (XANAX) 1 MG tablet TAKE 1 TABLET BY MOUTH 2 TIMES DAILY AS NEEDED FOR ANXIETY  60 tablet  2  . Dapsone 5 % topical gel Apply topically daily.      Marland Kitchen lamoTRIgine (LAMICTAL) 100 MG tablet Take 1 tablet (100 mg total) by mouth daily.  90 tablet  3  . Multiple Vitamin (MULTIVITAMIN) tablet Take 1 tablet by mouth daily.      Marland Kitchen spironolactone (ALDACTONE) 50 MG tablet Take 50 mg by mouth 2 (two) times daily. Take 1 tablet in the AM and 2 tablets in the PM      . temazepam (RESTORIL) 30 MG capsule Take 1 capsule (30 mg total) by mouth at bedtime  as needed for sleep.  90 capsule  1  . tretinoin (RETIN-A) 0.05 % cream Apply topically at bedtime.      Marland Kitchen venlafaxine XR (EFFEXOR-XR) 150 MG 24 hr capsule Take 1 capsule (150 mg total) by mouth daily.  90 capsule  3  . naproxen sodium (ANAPROX) 220 MG tablet Take 220 mg by mouth 2 (two) times daily as needed.        No current facility-administered medications for this visit.      Allergies  Allergen Reactions  . Hydrocodone   . Zithromax (Azithromycin Dihydrate)     REVIEW OF SYSTEMS: Skin:  No history of rash.  No history of abnormal moles.  Has seen Dr. Sharyn Lull for acne. Infection:  No history of hepatitis or HIV.  No history of MRSA. Neurologic:  No history of stroke.  No history of seizure.  No history of headaches. Cardiac:  No history of hypertension. No  history of heart disease.  No history of seeing a cardiologist. Pulmonary:  CPAP since 2013.  Saw Dr. Carolin Sicks.  Endocrine:  No diabetes. No thyroid disease. Gastrointestinal:  No history of stomach disease.  No history of liver disease.  No history of gall bladder disease.  No history of pancreas disease.  No history of colon disease. Urologic:  No history of kidney stones.  No history of bladder infections. GYN:  Has IUD.  Sees Dr. Tenny Craw for Gyn. Musculoskeletal:  History of AVN.  History of left hip replacement - Dr. Simeon Craft - 03/17/2012 Hematologic:  No bleeding disorder.  No history of anemia.  Not anticoagulated. Psycho-social:  The patient is oriented.   History of anxiety/depression. Lamictal stabilized mood.  Sees Judithe Modest, counselor, q 2 ot 3 months.  SOCIAL and FAMILY HISTORY: Divorced.  Lives by self. No children. 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  PHYSICAL EXAM: BP 138/84  Pulse 80  Resp 16  Ht 5\' 5"  (1.651 m)  Wt 263 lb 6.4 oz (119.477 kg)  BMI 43.83 kg/m2  General: WN obese WF who is alert and generally healthy appearing.  HEENT: Normal. Pupils equal. Neck: Supple. No mass.  No thyroid mass. Lymph Nodes:  No supraclavicular or cervical nodes. Lungs: Clear to auscultation and symmetric breath sounds. Heart:  RRR. No murmur or rub. Breasts:  Right: No mass or nipple discharge.  Left:  No mass or nipple discharge.  Abdomen: Soft. No mass. No tenderness. No hernia. Normal bowel sounds.  No abdominal scars. Rectal: Not done. Extremities:  Good strength and ROM  in upper and lower extremities. Neurologic:  Grossly intact to motor and sensory function. Psychiatric: Has normal mood and affect. Behavior is normal.   DATA REVIEWED: Notes in Epic  Ovidio Kin, MD,  [facility], [place] [address] [place]  [address]   [place], [place]    [address] Phone:  [phone] FAX:  [phone]

## 2013-02-08 ENCOUNTER — Ambulatory Visit: Payer: BC Managed Care – PPO | Admitting: *Deleted

## 2013-02-09 ENCOUNTER — Ambulatory Visit (INDEPENDENT_AMBULATORY_CARE_PROVIDER_SITE_OTHER): Payer: BC Managed Care – PPO | Admitting: Family Medicine

## 2013-02-09 ENCOUNTER — Encounter: Payer: Self-pay | Admitting: Family Medicine

## 2013-02-09 DIAGNOSIS — F32A Depression, unspecified: Secondary | ICD-10-CM

## 2013-02-09 DIAGNOSIS — F329 Major depressive disorder, single episode, unspecified: Secondary | ICD-10-CM

## 2013-02-09 DIAGNOSIS — F3289 Other specified depressive episodes: Secondary | ICD-10-CM

## 2013-02-09 MED ORDER — PHENTERMINE HCL 37.5 MG PO CAPS: 37.5000 mg | ORAL_CAPSULE | ORAL | Status: DC

## 2013-02-09 NOTE — Progress Notes (Signed)
  Subjective:    Patient ID: Helen Paul, female    DOB: [birth year], 37 y.o.   MRN: [id number]  HPI Here to discuss her weight issue and her anxiety. She is convinced that a lot of the weight gain in the past few year sis due to Effexor, which causes her to crave food. She has been on this for 10 yrs now. It has worked well for her and she is very hesitant to try anything else. She recently saw Dr. Ezzard Standing who told her that she would be a good candidate for an adjustable lap band surgery. She asks my advice about this and and asks if I can prescribe any sort of appetite suppressant.    Review of Systems  Constitutional: Negative.   Respiratory: Negative.   Cardiovascular: Negative.   Neurological: Negative.        Objective:   Physical Exam  Constitutional: She appears well-developed and well-nourished.  Cardiovascular: Normal rate, regular rhythm, normal heart sounds and intact distal pulses.   Pulmonary/Chest: Effort normal and breath sounds normal.          Assessment & Plan:  We will try Phentermine daily. Recheck in one month

## 2013-02-15 ENCOUNTER — Ambulatory Visit (HOSPITAL_COMMUNITY): Payer: BC Managed Care – PPO

## 2013-02-15 ENCOUNTER — Inpatient Hospital Stay (HOSPITAL_COMMUNITY): Admission: RE | Admit: 2013-02-15 | Payer: BC Managed Care – PPO | Source: Ambulatory Visit

## 2013-03-09 ENCOUNTER — Ambulatory Visit (INDEPENDENT_AMBULATORY_CARE_PROVIDER_SITE_OTHER): Payer: BC Managed Care – PPO | Admitting: Family Medicine

## 2013-03-09 ENCOUNTER — Encounter: Payer: Self-pay | Admitting: Family Medicine

## 2013-03-09 NOTE — Progress Notes (Signed)
  Subjective:    Patient ID: Helen Paul, female    DOB: 10/31/1975, 37 y.o.   MRN: [id number]  HPI Here to follow up on weight loss efforts and on taking Phentermine. She is very pleased with this and has lost 3 lbs. No side effects. She continues on her diet and her exercise routine.    Review of Systems  Constitutional: Negative.   Respiratory: Negative.   Cardiovascular: Negative.        Objective:   Physical Exam  Constitutional: She appears well-developed and well-nourished.  Cardiovascular: Normal rate, regular rhythm, normal heart sounds and intact distal pulses.   Pulmonary/Chest: Effort normal and breath sounds normal.          Assessment & Plan:  Recheck one month

## 2013-03-30 ENCOUNTER — Telehealth: Payer: Self-pay | Admitting: Family Medicine

## 2013-03-30 NOTE — Telephone Encounter (Signed)
Pt request refill of temazepam (RESTORIL) 30 MG capsule [facility] day Pt is out and request [facility] [place]

## 2013-03-31 NOTE — Telephone Encounter (Signed)
Call in #30 and [facility]

## 2013-04-01 MED ORDER — TEMAZEPAM 30 MG PO CAPS: 30.0000 mg | ORAL_CAPSULE | Freq: Every evening | ORAL | Status: DC | PRN

## 2013-04-01 NOTE — Telephone Encounter (Signed)
I [facility]ed [facility] to [facility] and called in a [facility] day supply to [facility], tried to reach pt and no answer.

## 2013-04-11 ENCOUNTER — Encounter: Payer: Self-pay | Admitting: Family Medicine

## 2013-04-11 ENCOUNTER — Ambulatory Visit (INDEPENDENT_AMBULATORY_CARE_PROVIDER_SITE_OTHER): Payer: BC Managed Care – PPO | Admitting: Family Medicine

## 2013-04-11 NOTE — Progress Notes (Signed)
  Subjective:    Patient ID: Helen Paul, female    DOB: 07/06/1976, 37 y.o.   MRN: [id number]  HPI Here to follow up weight loss efforts. She feels well. She is following her approved diet and exercising regularly.    Review of Systems  Constitutional: Negative.   Respiratory: Negative.   Cardiovascular: Negative.        Objective:   Physical Exam  Constitutional: She appears well-developed and well-nourished.  Cardiovascular: Normal rate, regular rhythm, normal heart sounds and intact distal pulses.   Pulmonary/Chest: Effort normal and breath sounds normal.          Assessment & Plan:  Continue current plan

## 2013-05-09 ENCOUNTER — Ambulatory Visit (INDEPENDENT_AMBULATORY_CARE_PROVIDER_SITE_OTHER): Payer: BC Managed Care – PPO | Admitting: Family Medicine

## 2013-05-09 ENCOUNTER — Encounter: Payer: Self-pay | Admitting: Family Medicine

## 2013-05-09 NOTE — Progress Notes (Signed)
  Subjective:    Patient ID: Helen Paul, female    DOB: [birth year], 37 y.o.   MRN: [id number]  HPI Here for a fourth follow up visit to track her efforts at weight loss. She continues with her supervised diet and exercise plan. She feels well.    Review of Systems  Constitutional: Negative.   Respiratory: Negative.   Cardiovascular: Negative.        Objective:   Physical Exam  Constitutional: She appears well-developed and well-nourished.  Cardiovascular: Normal rate, regular rhythm, normal heart sounds and intact distal pulses.   Pulmonary/Chest: Effort normal and breath sounds normal.          Assessment & Plan:  She is working toward having bariatric surgery. Recheck in one month.

## 2013-06-08 ENCOUNTER — Ambulatory Visit: Payer: BC Managed Care – PPO | Admitting: Family Medicine

## 2013-06-10 ENCOUNTER — Encounter: Payer: Self-pay | Admitting: *Deleted

## 2013-06-13 ENCOUNTER — Ambulatory Visit (INDEPENDENT_AMBULATORY_CARE_PROVIDER_SITE_OTHER): Payer: BC Managed Care – PPO | Admitting: Family Medicine

## 2013-06-13 ENCOUNTER — Encounter: Payer: Self-pay | Admitting: Family Medicine

## 2013-06-13 DIAGNOSIS — Z23 Encounter for immunization: Secondary | ICD-10-CM

## 2013-06-13 NOTE — Progress Notes (Signed)
   Subjective:    Patient ID: Helen Paul, female    DOB: 1976/06/29, 37 y.o.   MRN: [id number]  HPI Here for the 5th visit out of 6 planned to follow her for her weight loss efforts. She plans to have bariatric surgery. She still exercises regularly and follows her prescribed diet. She feels well.    Review of Systems  Constitutional: Negative.   Respiratory: Negative.   Cardiovascular: Negative.        Objective:   Physical Exam  Constitutional: She appears well-developed and well-nourished.  Cardiovascular: Normal rate, regular rhythm, normal heart sounds and intact distal pulses.   Pulmonary/Chest: Effort normal and breath sounds normal.          Assessment & Plan:  She is continuing on her weight loss plan. Recheck in one month

## 2013-06-13 NOTE — Progress Notes (Signed)
Pre visit review using our clinic review tool, if applicable. No additional management support is needed unless otherwise documented below in the visit note. 

## 2013-06-20 ENCOUNTER — Encounter: Payer: Self-pay | Admitting: Family Medicine

## 2013-06-20 ENCOUNTER — Ambulatory Visit (INDEPENDENT_AMBULATORY_CARE_PROVIDER_SITE_OTHER): Payer: BC Managed Care – PPO | Admitting: Family Medicine

## 2013-06-20 VITALS — BP 118/87 | HR 84 | Temp 98.4°F | Resp 18 | Ht 65.0 in | Wt 264.0 lb

## 2013-06-20 DIAGNOSIS — Z5189 Encounter for other specified aftercare: Secondary | ICD-10-CM

## 2013-06-20 DIAGNOSIS — L089 Local infection of the skin and subcutaneous tissue, unspecified: Secondary | ICD-10-CM

## 2013-06-20 MED ORDER — AMOXICILLIN-POT CLAVULANATE 875-125 MG PO TABS: 1.0000 | ORAL_TABLET | Freq: Two times a day (BID) | ORAL | Status: DC

## 2013-06-20 MED ORDER — MUPIROCIN 2 % EX OINT: TOPICAL_OINTMENT | CUTANEOUS | Status: DC

## 2013-06-20 NOTE — Progress Notes (Signed)
OFFICE NOTE  06/20/2013  CC:  Chief Complaint  Patient presents with  . Laceration    left hand, middle finger x Saturday     HPI: Patient is a 37 y.o. Caucasian female who is here for check of a finger laceration. Two days ago, cut middle finger on hedge trimmers, went to Urgent Care and says it was irrigated and glued.  They put a splint on it.  It has not been very painful.  One of the cuts is oozing a little bit.    Pertinent PMH:  Past Medical History  Diagnosis Date  . Depression   . Migraines   . Hyperlipidemia   . Arthritis   . Hip pain, bilateral   . Avascular necrosis of hip   . Sleep apnea     per Dr. Shelle Iron, uses CPAP     MEDS:  Outpatient Prescriptions Prior to Visit  Medication Sig Dispense Refill  . ALPRAZolam (XANAX) 1 MG tablet TAKE 1 TABLET BY MOUTH 2 TIMES DAILY AS NEEDED FOR ANXIETY  60 tablet  2  . Dapsone 5 % topical gel Apply topically daily.      Marland Kitchen lamoTRIgine (LAMICTAL) 100 MG tablet Take 1 tablet (100 mg total) by mouth daily.  90 tablet  3  . Multiple Vitamin (MULTIVITAMIN) tablet Take 1 tablet by mouth daily.      Marland Kitchen spironolactone (ALDACTONE) 50 MG tablet Take 50 mg by mouth 2 (two) times daily. Take 1 tablet in the AM and 2 tablets in the PM      . temazepam (RESTORIL) 30 MG capsule Take 1 capsule (30 mg total) by mouth at bedtime as needed for sleep.  90 capsule  1  . tretinoin (RETIN-A) 0.05 % cream Apply topically at bedtime.      Marland Kitchen venlafaxine XR (EFFEXOR-XR) 150 MG 24 hr capsule Take 1 capsule (150 mg total) by mouth daily.  90 capsule  3  . phentermine 37.5 MG capsule Take 1 capsule (37.5 mg total) by mouth every morning.  30 capsule  5   No facility-administered medications prior to visit.    PE: Blood pressure 118/87, pulse 84, temperature 98.4 F (36.9 C), temperature source Temporal, resp. rate 18, height 5\' 5"  (1.651 m), weight 264 lb (119.75 kg), last menstrual period 06/18/2013, SpO2 99.00%. Left hand middle finger with  small, healing lac on volar surface of distal phalanx and on volar surface of PIP jt of same finger.  Minimal drainage from the more proximal lesion. Index finger of left hand has small, healing lac on volar surface of middle phalanx, no drainage or erythema.  Minimal swelling at wound sites.  Minimal tenderness.  IMPRESSION AND PLAN:  Finger lacerations, possible early infection in the proximal-most lac on middle finger. I sealed the other two wounds a bit better with dermabond and gave bactroban ointment to apply tid to the one that is oozing a bit. Also rx'd augmentin 875mg  bid x 10d. Replaced splint/wrapped it today. Signs/symptoms to call or return for were reviewed and pt expressed understanding..  FOLLOW UP: prn

## 2013-07-11 ENCOUNTER — Ambulatory Visit: Payer: BC Managed Care – PPO | Admitting: Family Medicine

## 2013-07-14 ENCOUNTER — Encounter: Payer: Self-pay | Admitting: Family Medicine

## 2013-07-14 ENCOUNTER — Ambulatory Visit (INDEPENDENT_AMBULATORY_CARE_PROVIDER_SITE_OTHER): Payer: 59 | Admitting: Family Medicine

## 2013-07-14 NOTE — Progress Notes (Signed)
Pre visit review using our clinic review tool, if applicable. No additional management support is needed unless otherwise documented below in the visit note. 

## 2013-07-14 NOTE — Progress Notes (Signed)
   Subjective:    Patient ID: Helen Paul, female    DOB: Apr 12, 1976, 38 y.o.   MRN: [id number]  HPI Here for the 6th visit to discuss her weight loss efforts. She continues to watch a strict diet and exercise. She has been thinking about switching from Dr. Ezzard StandingNewman to [facility] because her sister went there and had a good experience.    Review of Systems  Constitutional: Negative.   Respiratory: Negative.   Cardiovascular: Negative.        Objective:   Physical Exam  Constitutional: She appears well-developed and well-nourished.  Cardiovascular: Normal rate, regular rhythm, normal heart sounds and intact distal pulses.   Pulmonary/Chest: Effort normal and breath sounds normal.          Assessment & Plan:  She has completed her 6 month series of visits to document her efforts at weight loss. She will decide about which surgeon she wants to follow up with and let us know.

## 2013-07-20 ENCOUNTER — Other Ambulatory Visit: Payer: Self-pay | Admitting: Family Medicine

## 2013-11-22 ENCOUNTER — Encounter: Payer: Self-pay | Admitting: Family Medicine

## 2013-11-23 MED ORDER — TEMAZEPAM 30 MG PO CAPS: 30.0000 mg | ORAL_CAPSULE | Freq: Every evening | ORAL | Status: DC | PRN

## 2013-11-23 NOTE — Telephone Encounter (Signed)
Call in Temazepam 30 mg qhs, #30 with no rf  

## 2014-01-13 HISTORY — PX: SLEEVE GASTROPLASTY: SHX1101

## 2014-02-28 ENCOUNTER — Ambulatory Visit (INDEPENDENT_AMBULATORY_CARE_PROVIDER_SITE_OTHER): Payer: 59 | Admitting: Family Medicine

## 2014-02-28 ENCOUNTER — Encounter: Payer: Self-pay | Admitting: Family Medicine

## 2014-02-28 VITALS — BP 105/76 | HR 70 | Temp 99.1°F | Ht 65.0 in | Wt 235.0 lb

## 2014-02-28 DIAGNOSIS — J039 Acute tonsillitis, unspecified: Secondary | ICD-10-CM

## 2014-02-28 MED ORDER — CEPHALEXIN 500 MG PO CAPS: 500.0000 mg | ORAL_CAPSULE | Freq: Three times a day (TID) | ORAL | Status: AC

## 2014-02-28 NOTE — Progress Notes (Signed)
   Subjective:    Patient ID: Helen Paul, female    DOB: 10-09-1975, 38 y.o.   MRN: [id number]  HPI Here for 3 days of a painful swollen right tonsil. No fever.    Review of Systems  Constitutional: Negative.   HENT: Positive for sore throat. Negative for congestion, postnasal drip, sinus pressure, trouble swallowing and voice change.   Eyes: Negative.   Respiratory: Negative.        Objective:   Physical Exam  Constitutional: She appears well-developed and well-nourished.  HENT:  Right Ear: External ear normal.  Left Ear: External ear normal.  Nose: Nose normal.  Mouth/Throat: No oropharyngeal exudate.  Right tonsil is red and swollen without exudate   Eyes: Conjunctivae are normal.  Neck: Neck supple.  Several tender enlarged right AC nodes   Pulmonary/Chest: Effort normal and breath sounds normal.          Assessment & Plan:  Treat with Keflex

## 2014-02-28 NOTE — Progress Notes (Signed)
Pre visit review using our clinic review tool, if applicable. No additional management support is needed unless otherwise documented below in the visit note. 

## 2014-03-25 ENCOUNTER — Other Ambulatory Visit: Payer: Self-pay | Admitting: Family Medicine

## 2014-04-28 ENCOUNTER — Other Ambulatory Visit: Payer: Self-pay | Admitting: Family Medicine

## 2014-05-01 NOTE — Telephone Encounter (Signed)
Call in #60 with 5 rf 

## 2014-07-25 ENCOUNTER — Encounter: Payer: Self-pay | Admitting: Family Medicine

## 2014-07-26 MED ORDER — TEMAZEPAM 30 MG PO CAPS: 30.0000 mg | ORAL_CAPSULE | Freq: Every evening | ORAL | Status: DC | PRN

## 2014-07-26 NOTE — Telephone Encounter (Signed)
[facility] is ready to be [facility]ed. Also call in #30 to her local pharmacy

## 2015-01-02 ENCOUNTER — Encounter: Payer: Self-pay | Admitting: Family Medicine

## 2015-01-02 ENCOUNTER — Ambulatory Visit (INDEPENDENT_AMBULATORY_CARE_PROVIDER_SITE_OTHER): Payer: 59 | Admitting: Family Medicine

## 2015-01-02 VITALS — BP 112/68 | HR 63 | Temp 98.6°F | Ht 65.0 in | Wt 187.0 lb

## 2015-01-02 DIAGNOSIS — G4700 Insomnia, unspecified: Secondary | ICD-10-CM | POA: Diagnosis not present

## 2015-01-02 DIAGNOSIS — F329 Major depressive disorder, single episode, unspecified: Secondary | ICD-10-CM

## 2015-01-02 DIAGNOSIS — F32A Depression, unspecified: Secondary | ICD-10-CM

## 2015-01-02 MED ORDER — VENLAFAXINE HCL ER 37.5 MG PO CP24: 37.5000 mg | ORAL_CAPSULE | Freq: Every day | ORAL | Status: DC

## 2015-01-02 MED ORDER — VENLAFAXINE HCL ER 75 MG PO CP24: 75.0000 mg | ORAL_CAPSULE | Freq: Every day | ORAL | Status: DC

## 2015-01-02 NOTE — Progress Notes (Signed)
Pre visit review using our clinic review tool, if applicable. No additional management support is needed unless otherwise documented below in the visit note. 

## 2015-01-02 NOTE — Progress Notes (Signed)
   Subjective:    Patient ID: Helen Paul, female    DOB: [birth year], 39 y.o.   MRN: [id number]  HPI Here to discuss her depression and insomnia. She has been doing very well and her life has been quite happy lately. She met her boyfriend 3 months ago and they have a wonderful relationship. In fact they are considering having children at some point. Her moods are stable and she wants to wean off all her meds. She actually stopped taking Xanax and Temazepam several months ago.    Review of Systems  Constitutional: Negative.   Respiratory: Negative.   Cardiovascular: Negative.   Neurological: Negative.   Psychiatric/Behavioral: Negative.        Objective:   Physical Exam  Constitutional: She is oriented to person, place, and time. She appears well-developed and well-nourished.  Cardiovascular: Normal rate, regular rhythm, normal heart sounds and intact distal pulses.   Pulmonary/Chest: Effort normal and breath sounds normal.  Neurological: She is alert and oriented to person, place, and time.  Psychiatric: She has a normal mood and affect. Her behavior is normal. Judgment and thought content normal.          Assessment & Plan:  We agreed to wean off her remaining meds. She will take 1/2 of lamictal tablet every day for 2 weeks and then stop. She will take 75 mg of Effexor XR daily for 2 weeks, then take 37.5 mg of this daily for 2 weeks, then take 37.5 mg every other day for 2 weeks, and then stop. Recheck prn. Encouraged her to exercise every day. We spent 40 minutes face to face discussing these issues.

## 2015-01-25 ENCOUNTER — Telehealth: Payer: Self-pay | Admitting: Family Medicine

## 2015-01-25 NOTE — Telephone Encounter (Addendum)
Pt was seen on 01-02-15 and needs to get back on temazepam for sleep. Pt is requesting [facility] dose [facility] [place]

## 2015-01-26 MED ORDER — TEMAZEPAM 15 MG PO CAPS: 15.0000 mg | ORAL_CAPSULE | Freq: Every evening | ORAL | Status: DC | PRN

## 2015-01-26 NOTE — Telephone Encounter (Signed)
I called in script 

## 2015-01-26 NOTE — Telephone Encounter (Signed)
Call in Temazepam 15 mg qhs, #30 with 2 rf

## 2015-02-25 ENCOUNTER — Other Ambulatory Visit: Payer: Self-pay | Admitting: Family Medicine

## 2015-03-12 ENCOUNTER — Other Ambulatory Visit: Payer: Self-pay | Admitting: Family Medicine

## 2015-05-08 ENCOUNTER — Encounter: Payer: Self-pay | Admitting: Family Medicine

## 2015-05-08 NOTE — Telephone Encounter (Signed)
Rather than Temazepam, I would try Melatonin 5 mg at bedtime

## 2015-06-26 ENCOUNTER — Encounter: Payer: Self-pay | Admitting: Family Medicine

## 2015-06-26 ENCOUNTER — Ambulatory Visit (INDEPENDENT_AMBULATORY_CARE_PROVIDER_SITE_OTHER): Payer: 59 | Admitting: Family Medicine

## 2015-06-26 VITALS — BP 104/61 | HR 58 | Temp 98.8°F | Ht 65.0 in | Wt 190.0 lb

## 2015-06-26 DIAGNOSIS — F329 Major depressive disorder, single episode, unspecified: Secondary | ICD-10-CM

## 2015-06-26 DIAGNOSIS — F32A Depression, unspecified: Secondary | ICD-10-CM

## 2015-06-26 DIAGNOSIS — Z23 Encounter for immunization: Secondary | ICD-10-CM | POA: Diagnosis not present

## 2015-06-26 MED ORDER — VENLAFAXINE HCL ER 75 MG PO CP24: 75.0000 mg | ORAL_CAPSULE | Freq: Every day | ORAL | Status: DC

## 2015-06-26 NOTE — Progress Notes (Signed)
   Subjective:    Patient ID: Helen Paul, female    DOB: 07-Sep-1975, 39 y.o.   MRN: [id number]  HPI Here to ask about getting back on meds for depression. She had been doing well on Effexor and Lamictal earlier this year and she was able to taper off all these in October. Now she is again having periods of sadness and irritability. Sleep and appetite are good. She will be married in 3 weeks and she is very excited about this.    Review of Systems  Constitutional: Negative.   Psychiatric/Behavioral: Positive for dysphoric mood. Negative for hallucinations, behavioral problems, confusion, decreased concentration and agitation. The patient is nervous/anxious.        Objective:   Physical Exam  Constitutional: She is oriented to person, place, and time. She appears well-developed and well-nourished.  Cardiovascular: Normal rate, regular rhythm, normal heart sounds and intact distal pulses.   Pulmonary/Chest: Effort normal and breath sounds normal.  Neurological: She is alert and oriented to person, place, and time.  Psychiatric: She has a normal mood and affect. Her behavior is normal. Judgment and thought content normal.          Assessment & Plan:  Depression, start back on Effexor XR 75 mg daily. Recheck one month

## 2015-06-26 NOTE — Progress Notes (Signed)
Pre visit review using our clinic review tool, if applicable. No additional management support is needed unless otherwise documented below in the visit note. 

## 2015-07-04 ENCOUNTER — Encounter: Payer: Self-pay | Admitting: Family Medicine

## 2015-07-08 NOTE — L&D Delivery Note (Addendum)
Delivery Note At 4:17 PM a viable and healthy female was delivered via Vaginal, Spontaneous Delivery (Presentation: Occiput ; Anterior ).  APGAR: 5, ; weight 5 lb 14.7 oz (2685 g).   Placenta status: spontaneous , intact.  Cord: 3V with leg cord x 1  Following delivery and repair the patient developed hypotension and tachycardia. Stat Labs were drawn. Hgb 10.4. Intermittent uterine atony was noted. 1000mcg of cytotec was placed rectally and methergine was administered after bimanual massage was performed and clot was removed from the lower uterine segment. The patient responded well to this and blood pressure returned to normotensive range   Anesthesia:  Epidural  Episiotomy:  2nd degree  Lacerations: 2nd degree Suture Repair: 3.0 vicryl Est. Blood Loss (mL):  500 cc   Mom to postpartum.  Baby to Couplet care / Skin to Skin.  Rana Adorno H. 03/07/2016, 4:54 PM

## 2015-08-15 LAB — OB RESULTS CONSOLE ABO/RH: RH Type: POSITIVE

## 2015-08-15 LAB — OB RESULTS CONSOLE HEPATITIS B SURFACE ANTIGEN: Hepatitis B Surface Ag: NEGATIVE

## 2015-08-15 LAB — OB RESULTS CONSOLE HIV ANTIBODY (ROUTINE TESTING): HIV: NONREACTIVE

## 2015-08-15 LAB — OB RESULTS CONSOLE GC/CHLAMYDIA
Chlamydia: NEGATIVE
Gonorrhea: NEGATIVE

## 2015-08-15 LAB — OB RESULTS CONSOLE ANTIBODY SCREEN: Antibody Screen: NEGATIVE

## 2015-08-15 LAB — OB RESULTS CONSOLE RUBELLA ANTIBODY, IGM: Rubella: IMMUNE

## 2015-08-15 LAB — OB RESULTS CONSOLE RPR: RPR: NONREACTIVE

## 2015-11-05 ENCOUNTER — Telehealth: Payer: Self-pay | Admitting: Family Medicine

## 2015-11-05 NOTE — Telephone Encounter (Signed)
Pt would like dr fry to accept her husband as new pt. Can I sch?

## 2015-11-05 NOTE — Telephone Encounter (Signed)
Yes I can see him thanks  

## 2015-11-06 NOTE — Telephone Encounter (Signed)
Pt husband has been schedule

## 2016-02-08 LAB — OB RESULTS CONSOLE GBS: GBS: POSITIVE

## 2016-03-03 ENCOUNTER — Other Ambulatory Visit: Payer: Self-pay | Admitting: Obstetrics and Gynecology

## 2016-03-04 ENCOUNTER — Telehealth (HOSPITAL_COMMUNITY): Payer: Self-pay | Admitting: *Deleted

## 2016-03-04 ENCOUNTER — Encounter (HOSPITAL_COMMUNITY): Payer: Self-pay | Admitting: *Deleted

## 2016-03-04 NOTE — Telephone Encounter (Signed)
Preadmission screen  

## 2016-03-07 ENCOUNTER — Encounter (HOSPITAL_COMMUNITY): Payer: Self-pay

## 2016-03-07 ENCOUNTER — Inpatient Hospital Stay (HOSPITAL_COMMUNITY)
Admission: RE | Admit: 2016-03-07 | Discharge: 2016-03-09 | DRG: 774 | Disposition: A | Discharge status: Home / Self Care | Payer: 59 | Source: Ambulatory Visit | Attending: Obstetrics and Gynecology | Admitting: Obstetrics and Gynecology

## 2016-03-07 ENCOUNTER — Inpatient Hospital Stay (HOSPITAL_COMMUNITY): Payer: 59 | Admitting: Anesthesiology

## 2016-03-07 VITALS — BP 106/62 | HR 68 | Temp 98.0°F | Resp 17 | Ht 65.0 in | Wt 209.0 lb

## 2016-03-07 DIAGNOSIS — Z8249 Family history of ischemic heart disease and other diseases of the circulatory system: Secondary | ICD-10-CM

## 2016-03-07 DIAGNOSIS — Z3A39 39 weeks gestation of pregnancy: Secondary | ICD-10-CM | POA: Diagnosis not present

## 2016-03-07 DIAGNOSIS — G4730 Sleep apnea, unspecified: Secondary | ICD-10-CM | POA: Diagnosis present

## 2016-03-07 DIAGNOSIS — O99824 Streptococcus B carrier state complicating childbirth: Secondary | ICD-10-CM | POA: Diagnosis present

## 2016-03-07 DIAGNOSIS — Z3403 Encounter for supervision of normal first pregnancy, third trimester: Secondary | ICD-10-CM | POA: Diagnosis present

## 2016-03-07 DIAGNOSIS — Z3490 Encounter for supervision of normal pregnancy, unspecified, unspecified trimester: Secondary | ICD-10-CM

## 2016-03-07 DIAGNOSIS — O99354 Diseases of the nervous system complicating childbirth: Secondary | ICD-10-CM | POA: Diagnosis present

## 2016-03-07 DIAGNOSIS — Z833 Family history of diabetes mellitus: Secondary | ICD-10-CM

## 2016-03-07 LAB — CBC
HCT: 33.4 % — ABNORMAL LOW (ref 36.0–46.0)
HCT: 31.3 % — ABNORMAL LOW (ref 36.0–46.0)
Hemoglobin: 11.7 g/dL — ABNORMAL LOW (ref 12.0–15.0)
Hemoglobin: 10.4 g/dL — ABNORMAL LOW (ref 12.0–15.0)
MCH: 30.1 pg (ref 26.0–34.0)
MCH: 29.3 pg (ref 26.0–34.0)
MCHC: 35 g/dL (ref 30.0–36.0)
MCHC: 33.2 g/dL (ref 30.0–36.0)
MCV: 85.9 fL (ref 78.0–100.0)
MCV: 88.2 fL (ref 78.0–100.0)
Platelets: 213 10*3/uL (ref 150–400)
Platelets: 225 10*3/uL (ref 150–400)
RBC: 3.89 MIL/uL (ref 3.87–5.11)
RBC: 3.55 MIL/uL — ABNORMAL LOW (ref 3.87–5.11)
RDW: 14.4 % (ref 11.5–15.5)
RDW: 14.3 % (ref 11.5–15.5)
WBC: 8 10*3/uL (ref 4.0–10.5)
WBC: 9.1 10*3/uL (ref 4.0–10.5)

## 2016-03-07 LAB — PROTIME-INR
INR: 0.96
Prothrombin Time: 12.8 seconds (ref 11.4–15.2)

## 2016-03-07 LAB — POSTPARTUM HEMORRHAGE PROTOCOL (BB NOTIFICATION)

## 2016-03-07 LAB — FIBRINOGEN: Fibrinogen: 402 mg/dL (ref 210–475)

## 2016-03-07 LAB — SAVE SMEAR

## 2016-03-07 LAB — ABO/RH: ABO/RH(D): O POS

## 2016-03-07 LAB — RPR: RPR Ser Ql: NONREACTIVE

## 2016-03-07 LAB — APTT: aPTT: 24 seconds (ref 24–36)

## 2016-03-07 LAB — PREPARE RBC (CROSSMATCH)

## 2016-03-07 MED ORDER — ACETAMINOPHEN 325 MG PO TABS: 650.0000 mg | ORAL_TABLET | ORAL | Status: DC | PRN

## 2016-03-07 MED ORDER — BENZOCAINE-MENTHOL 20-0.5 % EX AERO
1.0000 "application " | INHALATION_SPRAY | CUTANEOUS | Status: DC | PRN
  Administered 2016-03-08: 1 via TOPICAL
  Filled 2016-03-07 (×2): qty 56

## 2016-03-07 MED ORDER — VENLAFAXINE HCL ER 75 MG PO CP24
75.0000 mg | ORAL_CAPSULE | Freq: Every day | ORAL | Status: DC
  Administered 2016-03-08: 75 mg via ORAL
  Filled 2016-03-07 (×3): qty 1

## 2016-03-07 MED ORDER — SOD CITRATE-CITRIC ACID 500-334 MG/5ML PO SOLN: 30.0000 mL | ORAL | Status: DC | PRN

## 2016-03-07 MED ORDER — FLEET ENEMA 7-19 GM/118ML RE ENEM: 1.0000 | ENEMA | RECTAL | Status: DC | PRN

## 2016-03-07 MED ORDER — LACTATED RINGERS IV BOLUS (SEPSIS): 1000.0000 mL | Freq: Once | INTRAVENOUS | Status: DC

## 2016-03-07 MED ORDER — VENLAFAXINE HCL ER 75 MG PO CP24
75.0000 mg | ORAL_CAPSULE | Freq: Every day | ORAL | Status: DC
  Administered 2016-03-07: 75 mg via ORAL
  Filled 2016-03-07 (×2): qty 1

## 2016-03-07 MED ORDER — PENICILLIN G POTASSIUM 5000000 UNITS IJ SOLR
5.0000 10*6.[IU] | Freq: Once | INTRAVENOUS | Status: AC
  Administered 2016-03-07: 5 10*6.[IU] via INTRAVENOUS
  Filled 2016-03-07: qty 5

## 2016-03-07 MED ORDER — METHYLERGONOVINE MALEATE 0.2 MG/ML IJ SOLN
INTRAMUSCULAR | Status: AC
  Administered 2016-03-07: 0.2 mg
  Filled 2016-03-07: qty 1

## 2016-03-07 MED ORDER — MISOPROSTOL 200 MCG PO TABS: 1000.0000 ug | ORAL_TABLET | Freq: Once | ORAL | Status: DC

## 2016-03-07 MED ORDER — MISOPROSTOL 200 MCG PO TABS
ORAL_TABLET | ORAL | Status: AC
  Filled 2016-03-07: qty 5

## 2016-03-07 MED ORDER — TETANUS-DIPHTH-ACELL PERTUSSIS 5-2.5-18.5 LF-MCG/0.5 IM SUSP: 0.5000 mL | Freq: Once | INTRAMUSCULAR | Status: DC

## 2016-03-07 MED ORDER — LIDOCAINE HCL (PF) 1 % IJ SOLN
30.0000 mL | INTRAMUSCULAR | Status: DC | PRN
  Filled 2016-03-07: qty 30

## 2016-03-07 MED ORDER — ONDANSETRON HCL 4 MG/2ML IJ SOLN: 4.0000 mg | INTRAMUSCULAR | Status: DC | PRN

## 2016-03-07 MED ORDER — METHYLERGONOVINE MALEATE 0.2 MG/ML IJ SOLN
0.2000 mg | INTRAMUSCULAR | Status: DC | PRN
Start: 2016-03-07 — End: 2016-03-09

## 2016-03-07 MED ORDER — ZOLPIDEM TARTRATE 5 MG PO TABS: 5.0000 mg | ORAL_TABLET | Freq: Every evening | ORAL | Status: DC | PRN

## 2016-03-07 MED ORDER — OXYTOCIN 40 UNITS IN LACTATED RINGERS INFUSION - SIMPLE MED: 2.5000 [IU]/h | INTRAVENOUS | Status: DC

## 2016-03-07 MED ORDER — SODIUM CHLORIDE 0.9 % IV SOLN: Freq: Once | INTRAVENOUS | Status: DC

## 2016-03-07 MED ORDER — OXYTOCIN 40 UNITS IN LACTATED RINGERS INFUSION - SIMPLE MED
1.0000 m[IU]/min | INTRAVENOUS | Status: DC
  Administered 2016-03-07: 4 m[IU]/min via INTRAVENOUS
  Administered 2016-03-07: 2 m[IU]/min via INTRAVENOUS
  Filled 2016-03-07: qty 1000

## 2016-03-07 MED ORDER — METHYLERGONOVINE MALEATE 0.2 MG PO TABS: 0.2000 mg | ORAL_TABLET | ORAL | Status: DC | PRN

## 2016-03-07 MED ORDER — LACTATED RINGERS IV SOLN: 500.0000 mL | INTRAVENOUS | Status: DC | PRN

## 2016-03-07 MED ORDER — OXYTOCIN BOLUS FROM INFUSION: 500.0000 mL | Freq: Once | INTRAVENOUS | Status: DC

## 2016-03-07 MED ORDER — ONDANSETRON HCL 4 MG PO TABS: 4.0000 mg | ORAL_TABLET | ORAL | Status: DC | PRN

## 2016-03-07 MED ORDER — LACTATED RINGERS IV SOLN: 500.0000 mL | Freq: Once | INTRAVENOUS | Status: DC

## 2016-03-07 MED ORDER — FENTANYL CITRATE (PF) 100 MCG/2ML IJ SOLN
100.0000 ug | Freq: Once | INTRAMUSCULAR | Status: AC
  Administered 2016-03-07: 100 ug via INTRAVENOUS
  Filled 2016-03-07: qty 2

## 2016-03-07 MED ORDER — LIDOCAINE HCL (PF) 1 % IJ SOLN
INTRAMUSCULAR | Status: DC | PRN
  Administered 2016-03-07 (×2): 6 mL

## 2016-03-07 MED ORDER — PRENATAL MULTIVITAMIN CH
1.0000 | ORAL_TABLET | Freq: Every day | ORAL | Status: DC
  Filled 2016-03-07: qty 1

## 2016-03-07 MED ORDER — PHENYLEPHRINE 40 MCG/ML (10ML) SYRINGE FOR IV PUSH (FOR BLOOD PRESSURE SUPPORT)
80.0000 ug | PREFILLED_SYRINGE | INTRAVENOUS | Status: DC | PRN
  Filled 2016-03-07: qty 5
  Filled 2016-03-07: qty 10

## 2016-03-07 MED ORDER — SIMETHICONE 80 MG PO CHEW: 80.0000 mg | CHEWABLE_TABLET | ORAL | Status: DC | PRN

## 2016-03-07 MED ORDER — MISOPROSTOL 200 MCG PO TABS
1000.0000 ug | ORAL_TABLET | Freq: Once | ORAL | Status: AC
  Administered 2016-03-07: 200 ug via VAGINAL

## 2016-03-07 MED ORDER — PHENYLEPHRINE 40 MCG/ML (10ML) SYRINGE FOR IV PUSH (FOR BLOOD PRESSURE SUPPORT)
80.0000 ug | PREFILLED_SYRINGE | INTRAVENOUS | Status: DC | PRN
  Filled 2016-03-07: qty 5

## 2016-03-07 MED ORDER — DIPHENHYDRAMINE HCL 50 MG/ML IJ SOLN: 12.5000 mg | INTRAMUSCULAR | Status: DC | PRN

## 2016-03-07 MED ORDER — EPHEDRINE 5 MG/ML INJ
10.0000 mg | INTRAVENOUS | Status: DC | PRN
  Filled 2016-03-07: qty 4

## 2016-03-07 MED ORDER — FENTANYL 2.5 MCG/ML BUPIVACAINE 1/10 % EPIDURAL INFUSION (WH - ANES)
14.0000 mL/h | INTRAMUSCULAR | Status: DC | PRN
  Administered 2016-03-07: 14 mL/h via EPIDURAL
  Filled 2016-03-07: qty 125

## 2016-03-07 MED ORDER — METHYLERGONOVINE MALEATE 0.2 MG/ML IJ SOLN: 0.2000 mg | INTRAMUSCULAR | Status: DC | PRN

## 2016-03-07 MED ORDER — WITCH HAZEL-GLYCERIN EX PADS
1.0000 "application " | MEDICATED_PAD | CUTANEOUS | Status: DC | PRN
  Administered 2016-03-08: 1 via TOPICAL

## 2016-03-07 MED ORDER — PENICILLIN G POTASSIUM 5000000 UNITS IJ SOLR
2.5000 10*6.[IU] | INTRAVENOUS | Status: DC
  Administered 2016-03-07: 2.5 10*6.[IU] via INTRAVENOUS
  Filled 2016-03-07 (×4): qty 2.5

## 2016-03-07 MED ORDER — COCONUT OIL OIL
1.0000 "application " | TOPICAL_OIL | Status: DC | PRN
  Administered 2016-03-08: 1 via TOPICAL
  Filled 2016-03-07: qty 120

## 2016-03-07 MED ORDER — ONDANSETRON HCL 4 MG/2ML IJ SOLN: 4.0000 mg | Freq: Four times a day (QID) | INTRAMUSCULAR | Status: DC | PRN

## 2016-03-07 MED ORDER — SENNOSIDES-DOCUSATE SODIUM 8.6-50 MG PO TABS
2.0000 | ORAL_TABLET | ORAL | Status: DC
  Administered 2016-03-08 (×2): 2 via ORAL
  Filled 2016-03-07 (×2): qty 2

## 2016-03-07 MED ORDER — IBUPROFEN 600 MG PO TABS
600.0000 mg | ORAL_TABLET | Freq: Four times a day (QID) | ORAL | Status: DC
  Administered 2016-03-08 – 2016-03-09 (×7): 600 mg via ORAL
  Filled 2016-03-07 (×7): qty 1

## 2016-03-07 MED ORDER — TERBUTALINE SULFATE 1 MG/ML IJ SOLN
0.2500 mg | Freq: Once | INTRAMUSCULAR | Status: DC | PRN
  Filled 2016-03-07: qty 1

## 2016-03-07 MED ORDER — DIBUCAINE 1 % RE OINT: 1.0000 "application " | TOPICAL_OINTMENT | RECTAL | Status: DC | PRN

## 2016-03-07 MED ORDER — MISOPROSTOL 25 MCG QUARTER TABLET
25.0000 ug | ORAL_TABLET | ORAL | Status: DC | PRN
  Administered 2016-03-07: 25 ug via VAGINAL
  Filled 2016-03-07 (×2): qty 0.25
  Filled 2016-03-07: qty 1

## 2016-03-07 MED ORDER — LACTATED RINGERS IV SOLN
INTRAVENOUS | Status: DC
  Administered 2016-03-07: 1000 mL via INTRAVENOUS
  Administered 2016-03-07: 125 mL/h via INTRAVENOUS

## 2016-03-07 MED ORDER — DIPHENHYDRAMINE HCL 25 MG PO CAPS: 25.0000 mg | ORAL_CAPSULE | Freq: Four times a day (QID) | ORAL | Status: DC | PRN

## 2016-03-07 NOTE — Anesthesia Procedure Notes (Signed)
Epidural Patient location during procedure: OB Start time: 03/07/2016 2:05 PM  Staffing Anesthesiologist: Sherrian DiversENENNY, Traeson Dusza  Preanesthetic Checklist Completed: patient identified, site marked, surgical consent, pre-op evaluation, timeout performed, IV checked, risks and benefits discussed and monitors and equipment checked  Epidural Patient position: sitting Prep: DuraPrep Patient monitoring: blood pressure and heart rate Approach: midline Location: L3-L4 Injection technique: LOR saline  Needle:  Needle type: Tuohy  Needle gauge: 17 G Needle length: 9 cm Needle insertion depth: 7 cm Catheter type: closed end flexible Catheter size: 19 Gauge Catheter at skin depth: 15 cm Test dose: negative and Other  Assessment Events: blood not aspirated, injection not painful, no injection resistance, negative IV test and no paresthesia  Additional Notes Reason for block:procedure for pain

## 2016-03-07 NOTE — H&P (Signed)
Helen HaverMarian L Paul is a 40 y.o. female presenting for induction of labor for advanced maternal age  40 yo G1P0 @ 39+5 presents for IOL for AMA  Her pregnancy has been uncomplicated to this point. OB History    Gravida Para Term Preterm AB Living   1             SAB TAB Ectopic Multiple Live Births                 Past Medical History:  Diagnosis Date  . Anxiety   . Avascular necrosis of hip (HCC)   . Depression   . Hip pain, bilateral   . Hyperlipidemia   . Migraines   . Sleep apnea    per Dr. Shelle Ironlance, uses CPAP    Past Surgical History:  Procedure Laterality Date  . JOINT REPLACEMENT  03-18-11   left total hip, [facility]   . SLEEVE GASTROPLASTY  01-13-14   [facility]   . WISDOM TOOTH EXTRACTION     Family History: family history includes Chronic fatigue in her mother; Diabetes in her father; Fibromyalgia in her mother; Heart attack in her father and mother; Heart disease in her father and mother; Heart failure in her father; Hypertension in her father; Narcolepsy in her father; Obesity in her brother and sister. Social History:  reports that she has never smoked. She has never used smokeless tobacco. She reports that she drinks alcohol. She reports that she does not use drugs.     Maternal Diabetes: No Genetic Screening: Normal Maternal Ultrasounds/Referrals: Normal Fetal Ultrasounds or other Referrals:  None Maternal Substance Abuse:  No Significant Maternal Medications:  None Significant Maternal Lab Results:  None Other Comments:  None  ROS History Dilation: 10 Effacement (%): 60 Station: +1 Exam by:: sd Herr rn Blood pressure 135/74, pulse 81, temperature 98 F (36.7 C), temperature source Oral, resp. rate 18, height 5\' 5"  (1.651 m), weight 94.8 kg (209 lb), last menstrual period 05/26/2015, SpO2 100 %. Exam Physical Exam  Prenatal labs: ABO, Rh: --/--/O POS, O POS (09/01 0100) Antibody: NEG (09/01 0100) Rubella: Immune  (02/08 0000) RPR: Non Reactive (09/01 0100)  HBsAg: Negative (02/08 0000)  HIV: Non-reactive (02/08 0000)  GBS: Positive (08/04 0000)   Assessment/Plan: 1) Admit 2) Epidural on request 3) AROM/Pit when able 4) PCN for GBS   Helen Merida H. 03/07/2016, 4:58 PM

## 2016-03-07 NOTE — Progress Notes (Signed)
anesthesia unable to do epidural for 45+ min,

## 2016-03-07 NOTE — Progress Notes (Signed)
This note also relates to the following rows which could not be included: BP - Cannot attach notes to unvalidated device data Pulse Rate - Cannot attach notes to unvalidated device data    

## 2016-03-07 NOTE — Progress Notes (Signed)
This note also relates to the following rows which could not be included: BP - Cannot attach notes to unvalidated device data Pulse Rate - Cannot attach notes to unvalidated device data SpO2 - Cannot attach notes to unvalidated device data  Switch to corded U/S and Toco

## 2016-03-07 NOTE — Progress Notes (Signed)
Dr Tenny Crawoss in room the entire time pt was pushing

## 2016-03-07 NOTE — Anesthesia Pain Management Evaluation Note (Signed)
  CRNA Pain Management Visit Note  Patient: Helen HaverMarian L Ginsberg, 40 y.o., female  "Hello I am a member of the anesthesia team at [facility]. We have an anesthesia team available at all times to provide care throughout the hospital, including epidural management and anesthesia for C-section. I don't know your plan for the delivery whether it a natural birth, water birth, IV sedation, nitrous supplementation, doula or epidural, but we want to meet your pain goals."   1.Was your pain managed to your expectations on prior hospitalizations?    2.What is your expectation for pain management during this hospitalization?      3.How can we help you reach that goal?   Record the patient's initial score and the patient's pain goal.   Pain: 3  Pain Goal: 5 [facility] wants you to be able to say your pain was always managed very well.  Laban EmperorMalinova,Glorimar Stroope Hristova 03/07/2016

## 2016-03-08 LAB — CBC
HCT: 24.4 % — ABNORMAL LOW (ref 36.0–46.0)
Hemoglobin: 8.4 g/dL — ABNORMAL LOW (ref 12.0–15.0)
MCH: 29.8 pg (ref 26.0–34.0)
MCHC: 34.4 g/dL (ref 30.0–36.0)
MCV: 86.5 fL (ref 78.0–100.0)
Platelets: 185 10*3/uL (ref 150–400)
RBC: 2.82 MIL/uL — ABNORMAL LOW (ref 3.87–5.11)
RDW: 14.3 % (ref 11.5–15.5)
WBC: 11.4 10*3/uL — ABNORMAL HIGH (ref 4.0–10.5)

## 2016-03-08 MED ORDER — COMPLETENATE 29-1 MG PO CHEW
1.0000 | CHEWABLE_TABLET | Freq: Every day | ORAL | Status: DC
  Administered 2016-03-08: 1 via ORAL
  Filled 2016-03-08 (×3): qty 1

## 2016-03-08 NOTE — Progress Notes (Signed)
[facility] accompanied by Husband and RN. Color is pale, LCTA Bilat, Fundus is firm and one below the umbilicus with small rubra lochia and a small trickle of blood, but stops with massage and fundal pressure. Vitals WNL, patient has a saline lock in both wrists. Oriented to room, call light, visiting hours, paper work, etc... Pain scale is 1-2 at this time. Condition stable. Able to transfer from wheelchair to bed with steady gait.

## 2016-03-08 NOTE — Anesthesia Preprocedure Evaluation (Signed)
Anesthesia Evaluation  Patient identified by MRN, date of birth, ID band Patient awake    Reviewed: Allergy & Precautions, NPO status , Patient's Chart, lab work & pertinent test results  Airway Mallampati: II  TM Distance: >3 FB Neck ROM: Full    Dental no notable dental hx.    Pulmonary neg pulmonary ROS,    Pulmonary exam normal breath sounds clear to auscultation       Cardiovascular negative cardio ROS Normal cardiovascular exam Rhythm:Regular Rate:Normal     Neuro/Psych negative neurological ROS  negative psych ROS   GI/Hepatic negative GI ROS, Neg liver ROS,   Endo/Other  negative endocrine ROS  Renal/GU negative Renal ROS  negative genitourinary   Musculoskeletal negative musculoskeletal ROS (+)   Abdominal   Peds negative pediatric ROS (+)  Hematology negative hematology ROS (+)   Anesthesia Other Findings   Reproductive/Obstetrics negative OB ROS                             Anesthesia Physical Anesthesia Plan  ASA: II  Anesthesia Plan: Epidural   Post-op Pain Management:    Induction: Intravenous  Airway Management Planned: Natural Airway  Additional Equipment:   Intra-op Plan:   Post-operative Plan:   Informed Consent: I have reviewed the patients History and Physical, chart, labs and discussed the procedure including the risks, benefits and alternatives for the proposed anesthesia with the patient or authorized representative who has indicated his/her understanding and acceptance.   Dental advisory given  Plan Discussed with: CRNA  Anesthesia Plan Comments: (Informed consent obtained prior to proceeding including risk of failure, 1% risk of PDPH, risk of minor discomfort and bruising.  Discussed rare but serious complications including epidural abscess, permanent nerve injury, epidural hematoma.  Discussed alternatives to epidural analgesia and patient desires  to proceed.  Timeout performed pre-procedure verifying patient name, procedure, and platelet count.  Patient tolerated procedure well.  Late entry of pre-anesthetic evaluation. The pre-anesthetic evaluation was done on 03-07-16 at 1406.)       Anesthesia Quick Evaluation

## 2016-03-08 NOTE — Progress Notes (Signed)
Patient assisted up to bathroom to void in bathroom. Gait is steady and was able to void a large amount urine in toilet. Instructed in peri care then back to bed with steady gait. Condition stable. Vag bleeding is small to scant.

## 2016-03-08 NOTE — Progress Notes (Signed)
Assisted patient with adjusting breast pump equipment with proper positioning for comfort. At this time no colostrum expressed while pumping.

## 2016-03-08 NOTE — Progress Notes (Signed)
Patient assisted out of bed for second time to bathroom to void large amount of urine in toilet. Peri care completed then ambulated to bed with steady gait. Pain scale is 1. Condition stable.

## 2016-03-08 NOTE — Lactation Note (Signed)
This note was copied from a baby'Paul chart. Lactation Consultation Note  Patient Name: Helen Paul Reason for consult: Initial assessment;Infant < 6lbs Breastfeeding consultation services and support information given and reviewed with patient.  Pecola LeisureBaby is currently 2319 hours old.  She was a code apgar and mom had a PP hemorrhage.  Baby has been to the breast a few times for brief periods.  Baby just finished a bath a is currently rooting,  Assisted with positioning baby in football hold.  Instructed on hand expression and a few drops expressed into baby'Paul mouth.  Mom has flat nipples but breast tissue compressible.  Baby latched on easily and well.  Instructed on waking technique and breast massage to increase feeding activity.  DEBP set up and will be initiated after feeding done.  Instructed to feed with any cue and post pump/hand express every 3 hours and give any expressed milk to baby.  Encouraged to call with questions/assist.  Maternal Data Has patient been taught Hand Expression?: Yes Does the patient have breastfeeding experience prior to this delivery?: No  Feeding Feeding Type: Breast Fed Length of feed: 20 min  LATCH Score/Interventions Latch: Grasps breast easily, tongue down, lips flanged, rhythmical sucking. Intervention(Paul): Adjust position;Assist with latch;Breast massage;Breast compression  Audible Swallowing: A few with stimulation Intervention(Paul): Skin to skin Intervention(Paul): Hand expression;Alternate breast massage  Type of Nipple: Flat Intervention(Paul): Double electric pump  Comfort (Breast/Nipple): Soft / non-tender     Hold (Positioning): Assistance needed to correctly position infant at breast and maintain latch. Intervention(Paul): Breastfeeding basics reviewed;Support Pillows;Position options;Skin to skin  LATCH Score: 7  Lactation Tools Discussed/Used Pump Review: Setup, frequency, and cleaning;Milk Storage Initiated by:: LC Date  initiated:: 03/08/16   Consult Status Consult Status: Follow-up Date: 03/09/16 Follow-up type: In-patient    Huston FoleyMOULDEN, Helen Paul Paul, 12:03 PM

## 2016-03-08 NOTE — Anesthesia Postprocedure Evaluation (Signed)
Anesthesia Post Note  Patient: Helen Paul  Procedure(s) Performed: * No procedures listed *  Patient location during evaluation: Mother Baby Anesthesia Type: Epidural Level of consciousness: awake and alert Pain management: pain level controlled Vital Signs Assessment: post-procedure vital signs reviewed and stable Respiratory status: spontaneous breathing, nonlabored ventilation and respiratory function stable Cardiovascular status: stable Postop Assessment: no headache, no backache, epidural receding and patient able to bend at knees Anesthetic complications: no     Last Vitals:  Vitals:   03/08/16 0045 03/08/16 0357  BP: (!) 119/53 132/84  Pulse: 98 71  Resp: 18 20  Temp: 37.8 C 36.8 C    Last Pain:  Vitals:   03/08/16 0607  TempSrc:   PainSc: Asleep   Pain Goal: Patients Stated Pain Goal: 1 (03/08/16 0357)               Rica RecordsICKELTON,[name]

## 2016-03-08 NOTE — Progress Notes (Signed)
MOB was referred for history of depression/anxiety.  Referral is screened out by Clinical Social Worker because none of the following criteria appear to apply and  there are no reports impacting the pregnancy or her transition to the postpartum period. CSW does not deem it clinically necessary to further investigate at this time.  -History of anxiety/depression during this pregnancy, or of post-partum depression.  Per chart review, MOB was active in care with medication:  Effecxor 75mg. She continues to take medication, with no current symptoms.  - Diagnosis of anxiety and/or depression within last 3 years.-  - History of depression due to pregnancy loss/loss of child or -MOB's symptoms are currently being treated with medication and/or therapy.   Discussed case with RN. NO current concerns that warrant CSW intervention at this time. MOB appropriate with baby and emotions.  If needs arise or MOB is noticed becoming anxious or depressed, please re-consult, otherwise no barriers to DC. Please contact the Clinical Social Worker if needs arise or upon MOB request.    Susie Pousson LCSW, MSW Clinical Social Work: System Wide Float Coverage for [facility] N[facility] Clinical social worker [phone]  

## 2016-03-08 NOTE — Progress Notes (Addendum)
Post Partum Day 1 Subjective: no complaints, up ad lib, voiding, tolerating PO and + flatus  Patient denies pre-syncope symptoms with ambulation  Objective: Blood pressure 132/84, pulse 71, temperature 98.2 F (36.8 C), temperature source Oral, resp. rate 20, height 5\' 5"  (1.651 m), weight 94.8 kg (209 lb), last menstrual period 05/26/2015, SpO2 100 %, unknown if currently breastfeeding.  Physical Exam:  General: alert, cooperative and appears stated age Lochia: appropriate Uterine Fundus: firm   Recent Labs  03/07/16 1707 03/08/16 0557  HGB 10.4* 8.4*  HCT 31.3* 24.4*    Assessment/Plan: Breastfeeding  D/C IVs   LOS: 1 day   Helen Paul H. 03/08/2016, 10:32 AM

## 2016-03-09 ENCOUNTER — Inpatient Hospital Stay (HOSPITAL_COMMUNITY): Admission: AD | Admit: 2016-03-09 | Payer: 59 | Source: Ambulatory Visit | Admitting: Obstetrics and Gynecology

## 2016-03-09 MED ORDER — DOCUSATE SODIUM 100 MG PO CAPS: 100.0000 mg | ORAL_CAPSULE | Freq: Two times a day (BID) | ORAL | 0 refills | Status: DC

## 2016-03-09 MED ORDER — OXYCODONE-ACETAMINOPHEN 5-325 MG PO TABS: 1.0000 | ORAL_TABLET | ORAL | 0 refills | Status: DC | PRN

## 2016-03-09 MED ORDER — IBUPROFEN 600 MG PO TABS: 600.0000 mg | ORAL_TABLET | Freq: Four times a day (QID) | ORAL | 0 refills | Status: DC | PRN

## 2016-03-09 NOTE — Progress Notes (Signed)
Called to room by patient in tears c/o baby's constant crying. Patient utilized breast pump after feeds, but no results and requested a bottle supplement to feedings. Fed 15 ml formula via syringe per parent requested and after education.

## 2016-03-09 NOTE — Discharge Summary (Signed)
Obstetric Discharge Summary Reason for Admission: induction of labor Prenatal Procedures: NST and ultrasound Intrapartum Procedures: spontaneous vaginal delivery Postpartum Procedures: none Complications-Operative and Postpartum: 2nd degree perineal laceration Hemoglobin  Date Value Ref Range Status  03/08/2016 8.4 (L) 12.0 - 15.0 g/dL Final   HCT  Date Value Ref Range Status  03/08/2016 24.4 (L) 36.0 - 46.0 % Final    Physical Exam:  General: alert, cooperative and appears stated age 74Lochia: appropriate Uterine Fundus: firm  Discharge Diagnoses: Term Pregnancy-delivered  Discharge Information: Date: 03/09/2016 Activity: pelvic rest Diet: routine Medications: Ibuprofen, Colace and Percocet Condition: improved Instructions: refer to practice specific booklet Discharge to: home Follow-up Information    Helen Paul H., MD Follow up in 5 week(s).   Specialty:  Obstetrics and Gynecology Why:  For a postpartum evaluation Contact information: [address] [place] [address] [phone]           Newborn Data: Live born female  Birth Weight: 5 lb 14.7 oz (2685 g) APGAR: 5, 7  Home with mother.  Helen Paul H. 03/09/2016, 10:59 AM

## 2016-03-09 NOTE — Lactation Note (Signed)
This note was copied from a baby's chart. Lactation Consultation Note  Patient Name: Helen Paul ZOXWR'UToday's Date: 03/09/2016 Reason for consult: Follow-up assessment  Visited with Mom and FOB on day of discharge, baby 1241 hrs old.  Mom and FOB very tired, as baby was fussy and they offered first supplement of formula ( 15 ml) at 2:30 am.  Mom's nipples are both abraded on tips, has coconut oil at bedside.  Demonstrated manual breast expression, with return demo done.  Colostrum drops noted, and encouraged use for nipple soreness.  Offered assist with feeding baby.  Baby unable to attain a deep areolar latch.  Mom has large, heavy, soft breasts, and baby is 5#9.1 today.  Baby trying to sustain a latch, but repeated attempts made.  Initiated the 20 mm NS.  Mom needing a lot of guidance on hand placement to help baby sustain a deeper latch.  Initially baby sucking on and off the nipple tip.  Initiated Alimentum supplementation using curved tip syringe.  15 ml taken under nipple shield.  Mom feeling less soreness, and baby's mouth slightly more wide on breast as supplement is flowing.  Encouraged Mom to pump regularly to support her milk supply.  Volume parameters given for supplement.  Discussed "paced method" of bottle feeding as an option for supplementation.  Parents grateful for options, as they are very tired.  OP appointment made for Thursday, Sept 7th @ 1pm.    Helen Paul, Helen Paul 03/09/2016, 10:16 AM

## 2016-03-11 LAB — TYPE AND SCREEN
ABO/RH(D): O POS
Antibody Screen: NEGATIVE
Unit division: 0
Unit division: 0

## 2016-03-13 ENCOUNTER — Ambulatory Visit (HOSPITAL_COMMUNITY)
Admit: 2016-03-13 | Discharge: 2016-03-13 | Disposition: A | Discharge status: Home / Self Care | Payer: 59 | Attending: Obstetrics and Gynecology | Admitting: Obstetrics and Gynecology

## 2016-03-13 NOTE — Lactation Note (Signed)
Lactation Consult  Mother's reason for visit: follow up from the hospital Visit Type: feeding assisstance Appointment Notes: mother states that all problems seems so much better when her milk came in . Consult:  Initial Lactation Consultant:  Helen Paul, Helen Paul  ________________________________________________________________________    ________________________________________________________________________  Mother's Name: Helen Paul Type of delivery:  vaginal Breastfeeding Experience:none Maternal Medical Conditions:  Post-partum hemorrhage and Gastric bypass Maternal Medications: effexor, vits, b12, iron, motrin  ________________________________________________________________________  Breastfeeding History (Post Discharge)  Frequency of breastfeeding: every 3-4 hours Duration of feeding:2115mins on each breast    Infant Intake and Output Assessment  6 -8 Voids:  in 24 hrs.  Color:  Clear yellow   Stools: 3 in 24 hrs.  Color:  Brown  ________________________________________________________________________  Maternal Breast Assessment  Breast:  Full Nipple:  erect Pain level:  0 Pain interventions:  Bra  _______________________________________________________________________ Feeding Assessment/Evaluation    Mother needed little assistance to latch infant . Infant latched on and observed good depth with strong suckling. Mother was taught to do breast compression .    Infant's oral assessment:  WNL  Positioning:  Cross cradle Right breast  LATCH documentation:  Latch:  2 = Grasps breast easily, tongue down, lips flanged, rhythmical sucking.  Audible swallowing:  2 = Spontaneous and intermittent  Type of nipple:  2 = Everted at rest and after stimulation  Comfort (Breast/Nipple):  1 = Filling, red/small blisters or bruises, mild/mod discomfort  Hold (Positioning):  1 = Assistance needed to correctly position infant at breast and maintain latch  LATCH  score:  8  Attached assessment:  Deep  Lips flanged:  Yes.    Lips untucked:  Yes.    Suck assessment:  Displays both   Pre-feed weight: 2622 Post-feed weight:  2672 Amount transferred:  50ml     Infant's oral assessment:  WNL  Positioning:  Football Left breast  LATCH documentation:  Latch:  2 = Grasps breast easily, tongue down, lips flanged, rhythmical sucking.  Audible swallowing:  2 = Spontaneous and intermittent  Type of nipple:  2 = Everted at rest and after stimulation  Comfort (Breast/Nipple):  1 = Filling, red/small blisters or bruises, mild/mod discomfort  Hold (Positioning):  1 = Assistance needed to correctly position infant at breast and maintain latch  LATCH score:  8  Attached assessment:  Deep  Lips flanged:  Yes.    Lips untucked:  Yes.    Suck assessment:  Displays both     Pre-feed weight:2672   Post-feed weight 2692 Amount transferred: 20 ml  Total amount transferred:  70 ml Mother advised to continue to breastfeed 8-12 times in 24hours Suggested to limit or avoid pacifier Mother to breastfeed on both breast using breast compression to keep Helen Paul interested in the feeding.  Mother to continue good pumping for one more week and than tapper off to 1-2 times daily. Nap frequently , follow up next week with Dr Vaughan BastaSummer

## 2016-03-18 ENCOUNTER — Telehealth (HOSPITAL_COMMUNITY): Payer: Self-pay | Admitting: Lactation Services

## 2016-03-18 NOTE — Telephone Encounter (Signed)
Baby is 11 days. Mom has knots under the R areola that can be express and softened but "a little while later" they get hard again. When the baby comes off the breast her nipple looks "pussy". She does have cracked nipples. The home visiting RN told her it was a plugged duct and use warm moist compress on the area before bf. She thinks she has an infection.   She should have an IBCLC or MD look at the breast. She made and OP apt for 03/19/16 at 1600 and she will call her MD office in the am.

## 2016-03-19 ENCOUNTER — Ambulatory Visit (HOSPITAL_COMMUNITY): Payer: 59

## 2016-03-19 ENCOUNTER — Ambulatory Visit (HOSPITAL_COMMUNITY): Admission: RE | Admit: 2016-03-19 | Payer: 59 | Source: Ambulatory Visit

## 2016-04-03 ENCOUNTER — Ambulatory Visit (HOSPITAL_COMMUNITY)
Admission: RE | Admit: 2016-04-03 | Discharge: 2016-04-03 | Disposition: A | Discharge status: Home / Self Care | Payer: 59 | Source: Ambulatory Visit | Attending: Family Medicine | Admitting: Family Medicine

## 2016-04-03 NOTE — Lactation Note (Addendum)
Lactation Consult for Helen Paul (mother) and Helen Paul (DOB: [birth year])  Mother's reason for visit:  Consult:  Follow-Up Lactation Consultant:  Remigio EisenmengerRichey, Arseniy Toomey [place]  ________________________________________________________________________ BW: 1191Y: 2685g (5# 14.7oz) 03-13-16: 2622g (about 5# 12 oz) 9-19: 6# 12 oz (1 lb in 12 days) Today's weight: 7# 10.4oz  ________________________________________________  Mother's Name: Helen Paul Type of delivery:  Vag Breastfeeding Experience:  primip Maternal Medical Conditions:  gastroplasty sleeve Maternal Medications: Effexor 75mg  qd  ________________________________________________________________________  Breastfeeding History (Post Discharge)  Frequency of breastfeeding: q3h Duration of feeding: 1.5 hour  Supplementation Brand: Similac : 4 oz bottle once or twice/day.   Method:  Bottle ([facility])     Infant Intake and Output Assessment  Voids: 8 in 24 hrs.  Color:  Clear yellow Stools: 10 in 24 hrs.  Color:  Yellow  ________________________________________________________________________  Maternal Breast Assessment  Breast:  Full Nipple:  Erect  _______________________________________________________________________ Feeding Assessment/Evaluation  Initial feeding assessment:  Infant's oral assessment:  WNL  Attached assessment:  Deep  Lips flanged:  Yes.   Suck assessment:  Displays both  Pre-feed weight: 3468 g   Post-feed weight: 3538 g  Amount transferred: 70 ml L breast, 16 min  Pre-feed weight: 3538 g   Post-feed weight: 3552 g  Amount transferred: 14 ml R breast, 5 min  Pre-feed weight: 3552 g   Post-feed weight: 3572 g  Amount transferred: 20 ml  Total amount transferred: 96 ml (infant fed more than that, but was not weighed again due to time constraints).   "Helen Paul" is almost 204 weeks old & is gaining weight nicely. She is almost 2 lbs above BW. She has gained 14 oz over the last 10 days.    Of concern is the fact that it takes Helen 1.5 hours to feed. It appears that something is tiring the infant as she feeds. Helen Paul is unable to maintain a normal suckling pattern b/c of the breaks she takes to breathe (which can seem almost like panting). Parents report that she has always fed like this. I observed tachypnea w/mild retractions during the feedings. Helen Paul would swallow a few times, breathe quickly for 7-8 breaths (w/mild jaw fluttering), swallow a few times, have more rapid breathing, and so on. This (or some variation thereof) happened each time she went to the breast. It took Helen 1.5 hours to feed during our consult. Although Mom has an abundant supply, the issue does not seem to be in response to fast flow.  She maintained her nice pink color throughout the feeding & no nasal flaring was noted. I've encouraged parents to use their phone to take a video to show their provider. I did note tachypnea during the consult that was unrelated to feeding. At times, a respiratory rate of 100 could be counted. Parents will be calling their pediatrician for further evaluation.   Parents report that bottle-feeding 4 oz can take about an hour. They have been using the Level 0 nipple. Dad reports that he inadvertently used a Level 1 nipple the other day & the infant took the feeding in about 5 minutes. I have encouraged parents to switch to a level 1 nipple.   Maternal concern: Mom noted to have biphasic color changes on the end of her nipple that corresponds with vasospasm. The nipple remains blanched for some time. During that time, Mom has shooting/burning pains until her nipple turns to its normal pink color. I recommended that Mom apply heat to the nipples/breasts.   Helen HewKim Swara Donze, RN,  IBCLC Note: Although this was not an emergency situation, I did call the after-hours line at the pediatrician's office to notify the physician on-call, but the after-hours nurse line did not pick up in a  timely manner.  04/04/16: Update given to Cliffton Asters, PA-C around 07:50 this morning. She will ensure that the office calls Mrs. Rogacki today (if Mrs. Rosenberry has not already contacted them). KR

## 2017-02-03 ENCOUNTER — Ambulatory Visit (INDEPENDENT_AMBULATORY_CARE_PROVIDER_SITE_OTHER): Payer: 59 | Admitting: Family Medicine

## 2017-02-03 ENCOUNTER — Encounter: Payer: Self-pay | Admitting: Family Medicine

## 2017-02-03 VITALS — BP 100/75 | HR 67 | Temp 98.6°F | Ht 65.0 in | Wt 209.0 lb

## 2017-02-03 DIAGNOSIS — Z0000 Encounter for general adult medical examination without abnormal findings: Secondary | ICD-10-CM | POA: Diagnosis not present

## 2017-02-03 LAB — BASIC METABOLIC PANEL
BUN: 16 mg/dL (ref 6–23)
CO2: 27 mEq/L (ref 19–32)
Calcium: 9 mg/dL (ref 8.4–10.5)
Chloride: 107 mEq/L (ref 96–112)
Creatinine, Ser: 0.63 mg/dL (ref 0.40–1.20)
GFR: 110.49 mL/min (ref >60.00)
Glucose, Bld: 88 mg/dL (ref 70–99)
Potassium: 4 mEq/L (ref 3.5–5.1)
Sodium: 140 mEq/L (ref 135–145)

## 2017-02-03 LAB — HEPATIC FUNCTION PANEL
ALT: 13 U/L (ref 0–35)
AST: 16 U/L (ref 0–37)
Albumin: 4.1 g/dL (ref 3.5–5.2)
Alkaline Phosphatase: 74 U/L (ref 39–117)
Bilirubin, Direct: 0.1 mg/dL (ref 0.0–0.3)
Total Bilirubin: 0.5 mg/dL (ref 0.2–1.2)
Total Protein: 6.4 g/dL (ref 6.0–8.3)

## 2017-02-03 LAB — POC URINALSYSI DIPSTICK (AUTOMATED)
Bilirubin, UA: NEGATIVE
Blood, UA: NEGATIVE
Clarity, UA: NEGATIVE
Glucose, UA: NEGATIVE
Ketones, UA: NEGATIVE
Leukocytes, UA: NEGATIVE
Nitrite, UA: NEGATIVE
Protein, UA: NEGATIVE
Spec Grav, UA: >=1.03 — AB (ref 1.010–1.025)
Urobilinogen, UA: 0.2 E.U./dL
pH, UA: 6 (ref 5.0–8.0)

## 2017-02-03 LAB — TSH: TSH: 2.32 u[IU]/mL (ref 0.35–4.50)

## 2017-02-03 LAB — CBC WITH DIFFERENTIAL/PLATELET
Basophils Absolute: 0 10*3/uL (ref 0.0–0.1)
Basophils Relative: 0.7 % (ref 0.0–3.0)
Eosinophils Absolute: 0.1 10*3/uL (ref 0.0–0.7)
Eosinophils Relative: 2 % (ref 0.0–5.0)
HCT: 38.9 % (ref 36.0–46.0)
Hemoglobin: 12.8 g/dL (ref 12.0–15.0)
Lymphocytes Relative: 32 % (ref 12.0–46.0)
Lymphs Abs: 1.7 10*3/uL (ref 0.7–4.0)
MCHC: 33 g/dL (ref 30.0–36.0)
MCV: 90.2 fl (ref 78.0–100.0)
Monocytes Absolute: 0.3 10*3/uL (ref 0.1–1.0)
Monocytes Relative: 5.8 % (ref 3.0–12.0)
Neutro Abs: 3.2 10*3/uL (ref 1.4–7.7)
Neutrophils Relative %: 59.5 % (ref 43.0–77.0)
Platelets: 299 10*3/uL (ref 150.0–400.0)
RBC: 4.31 Mil/uL (ref 3.87–5.11)
RDW: 13.8 % (ref 11.5–15.5)
WBC: 5.4 10*3/uL (ref 4.0–10.5)

## 2017-02-03 LAB — LIPID PANEL
Cholesterol: 191 mg/dL (ref 0–200)
HDL: 54.8 mg/dL (ref >39.00)
LDL Cholesterol: 111 mg/dL — ABNORMAL HIGH (ref 0–99)
NonHDL: 135.75
Total CHOL/HDL Ratio: 3
Triglycerides: 123 mg/dL (ref 0.0–149.0)
VLDL: 24.6 mg/dL (ref 0.0–40.0)

## 2017-02-03 MED ORDER — VENLAFAXINE HCL ER 75 MG PO CP24: 75.0000 mg | ORAL_CAPSULE | Freq: Every day | ORAL | 3 refills | Status: DC

## 2017-02-03 MED ORDER — LEVONORGESTREL 20 MCG/24HR IU IUD: 1.0000 | INTRAUTERINE_SYSTEM | Freq: Once | INTRAUTERINE | 0 refills | Status: AC

## 2017-02-03 NOTE — Patient Instructions (Signed)
WE NOW OFFER   [facility] FAST TRACK!!!  SAME DAY Appointments for ACUTE CARE  Such as: Sprains, Injuries, cuts, abrasions, rashes, muscle pain, joint pain, back pain Colds, flu, sore throats, headache, allergies, cough, fever  Ear pain, sinus and eye infections Abdominal pain, nausea, vomiting, diarrhea, upset stomach Animal/insect bites  3 Easy Ways to Schedule: Walk-In Scheduling Call in scheduling Mychart Sign-up: [email or web address]         

## 2017-02-03 NOTE — Progress Notes (Signed)
   Subjective:    Patient ID: Helen Paul, female    DOB: [birth year], 41 y.o.   MRN: [id number]  HPI Here for a well exam. She feels great. She gave birth to her daughter 2811 months ago and she breast fed for 9 months. She eats a balanced diet and she takes a multivitamin daily.    Review of Systems  Constitutional: Negative.   HENT: Negative.   Eyes: Negative.   Respiratory: Negative.   Cardiovascular: Negative.   Gastrointestinal: Negative.   Genitourinary: Negative for decreased urine volume, difficulty urinating, dyspareunia, dysuria, enuresis, flank pain, frequency, hematuria, pelvic pain and urgency.  Musculoskeletal: Negative.   Skin: Negative.   Neurological: Negative.   Psychiatric/Behavioral: Negative.        Objective:   Physical Exam  Constitutional: She is oriented to person, place, and time. She appears well-developed and well-nourished. No distress.  HENT:  Head: Normocephalic and atraumatic.  Right Ear: External ear normal.  Left Ear: External ear normal.  Nose: Nose normal.  Mouth/Throat: Oropharynx is clear and moist. No oropharyngeal exudate.  Eyes: Pupils are equal, round, and reactive to light. Conjunctivae and EOM are normal. No scleral icterus.  Neck: Normal range of motion. Neck supple. No JVD present. No thyromegaly present.  Cardiovascular: Normal rate, regular rhythm, normal heart sounds and intact distal pulses.  Exam reveals no gallop and no friction rub.   No murmur heard. Pulmonary/Chest: Effort normal and breath sounds normal. No respiratory distress. She has no wheezes. She has no rales. She exhibits no tenderness.  Abdominal: Soft. Bowel sounds are normal. She exhibits no distension and no mass. There is no tenderness. There is no rebound and no guarding.  Musculoskeletal: Normal range of motion. She exhibits no edema or tenderness.  Lymphadenopathy:    She has no cervical adenopathy.  Neurological: She is alert and oriented to person,  place, and time. She has normal reflexes. No cranial nerve deficit. She exhibits normal muscle tone. Coordination normal.  Skin: Skin is warm and dry. No rash noted. No erythema.  Psychiatric: She has a normal mood and affect. Her behavior is normal. Judgment and thought content normal.          Assessment & Plan:  Well exam. We discussed diet and exercise. Get fasting labs.  [name], MD

## 2017-03-26 ENCOUNTER — Encounter: Payer: Self-pay | Admitting: Family Medicine

## 2017-06-02 ENCOUNTER — Encounter: Payer: Self-pay | Admitting: Family Medicine

## 2017-06-02 ENCOUNTER — Ambulatory Visit (INDEPENDENT_AMBULATORY_CARE_PROVIDER_SITE_OTHER): Payer: 59 | Admitting: Family Medicine

## 2017-06-02 VITALS — BP 118/70 | HR 70 | Temp 98.7°F | Wt 221.4 lb

## 2017-06-02 DIAGNOSIS — J018 Other acute sinusitis: Secondary | ICD-10-CM

## 2017-06-02 MED ORDER — AMOXICILLIN-POT CLAVULANATE 875-125 MG PO TABS: 1.0000 | ORAL_TABLET | Freq: Two times a day (BID) | ORAL | 0 refills | Status: DC

## 2017-06-02 NOTE — Progress Notes (Signed)
   Subjective:    Patient ID: Landis GandyMarian M Stinson, female    DOB: [birth year], 41 y.o.   MRN: [id number]  HPI Here for 4 days of sinus pressure, PND, and ST. No fever or cough.    Review of Systems  Constitutional: Negative.   HENT: Positive for congestion, postnasal drip, sinus pressure, sinus pain and sore throat.   Eyes: Negative.   Respiratory: Negative.        Objective:   Physical Exam  Constitutional: She appears well-developed and well-nourished.  HENT:  Right Ear: External ear normal.  Left Ear: External ear normal.  Nose: Nose normal.  Mouth/Throat: Oropharynx is clear and moist.  Eyes: Conjunctivae are normal.  Neck: No thyromegaly present.  Pulmonary/Chest: Effort normal and breath sounds normal. No respiratory distress. She has no wheezes. She has no rales.  Lymphadenopathy:    She has no cervical adenopathy.          Assessment & Plan:  Sinusitis, treat with Augmentin. Add Mucinex prn. Gershon CraneStephen Gio Janoski, MD

## 2017-07-14 DIAGNOSIS — F411 Generalized anxiety disorder: Secondary | ICD-10-CM | POA: Diagnosis not present

## 2017-07-28 DIAGNOSIS — F411 Generalized anxiety disorder: Secondary | ICD-10-CM | POA: Diagnosis not present

## 2017-08-11 DIAGNOSIS — F411 Generalized anxiety disorder: Secondary | ICD-10-CM | POA: Diagnosis not present

## 2017-08-26 DIAGNOSIS — F411 Generalized anxiety disorder: Secondary | ICD-10-CM | POA: Diagnosis not present

## 2017-09-09 DIAGNOSIS — F411 Generalized anxiety disorder: Secondary | ICD-10-CM | POA: Diagnosis not present

## 2017-09-22 DIAGNOSIS — F411 Generalized anxiety disorder: Secondary | ICD-10-CM | POA: Diagnosis not present

## 2017-10-07 DIAGNOSIS — F411 Generalized anxiety disorder: Secondary | ICD-10-CM | POA: Diagnosis not present

## 2017-10-20 DIAGNOSIS — F411 Generalized anxiety disorder: Secondary | ICD-10-CM | POA: Diagnosis not present

## 2017-11-03 DIAGNOSIS — F411 Generalized anxiety disorder: Secondary | ICD-10-CM | POA: Diagnosis not present

## 2017-11-13 ENCOUNTER — Other Ambulatory Visit: Payer: Self-pay | Admitting: Family Medicine

## 2017-11-17 DIAGNOSIS — F411 Generalized anxiety disorder: Secondary | ICD-10-CM | POA: Diagnosis not present

## 2017-12-08 DIAGNOSIS — F411 Generalized anxiety disorder: Secondary | ICD-10-CM | POA: Diagnosis not present

## 2018-01-12 DIAGNOSIS — F411 Generalized anxiety disorder: Secondary | ICD-10-CM | POA: Diagnosis not present

## 2018-01-14 ENCOUNTER — Ambulatory Visit (INDEPENDENT_AMBULATORY_CARE_PROVIDER_SITE_OTHER): Payer: BLUE CROSS/BLUE SHIELD | Admitting: Family Medicine

## 2018-01-14 ENCOUNTER — Encounter: Payer: Self-pay | Admitting: Family Medicine

## 2018-01-14 DIAGNOSIS — Z1389 Encounter for screening for other disorder: Secondary | ICD-10-CM

## 2018-01-14 DIAGNOSIS — Z6839 Body mass index (BMI) 39.0-39.9, adult: Principal | ICD-10-CM

## 2018-01-14 DIAGNOSIS — E669 Obesity, unspecified: Secondary | ICD-10-CM

## 2018-01-14 NOTE — Patient Instructions (Addendum)
-   Call [employer] to ask if you get three medical nutrition therapy visits per DIAGNOSIS or if it is three visits per year regardless of diagnoses.    - What helped you when you managed your eating and weight well: - Tracking behaviors - RULES - Keeping the best foods on hand, and the tempting ones away - Advance planning for meals and snacks - A vision of likely success - Support from friends/family  Goals: 1. Eat at least 3 REAL meals and 1-2 snacks per day.  Aim for no more than 5 hours between eating.  Eat breakfast within one hour of getting up.  A REAL meal includes at least some protein, (some starch), and vegetables.   OR:  Would you serve this to a guest in your home, and call it a meal? 2. Set aside a few minutes to plan meals and snacks for the week, including groceries needed.   3. Physical activity: Walk at least 20 minutes three times a week.     - In addition, ask about the possibility of a stand-up desk, and if you get one, remember to not just stand, but move at least a little every half-hour.  The objective is to move intermittently through the day.    At follow-up, we will talk about managing emotional eating and negative thoughts.

## 2018-01-14 NOTE — Progress Notes (Signed)
Medical Nutrition Therapy:  Appt start time: 1530 end time:  1630. PCP [name], MD Therapist [name], LCSW  Assessment:  Primary concerns today: Weight management.  Helen Paul was referred by therapist [name], whom she sees about twice a month.      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  Pre-surg wt was 275 lb, and she lost to 187 lb by June 2016.  She gave birth to her 1st baby in Sept 2017, soon after which she started regaining weight.  Helen Paul said when she was nursing her baby she started craving sugary foods, especially drawn to Gummi Bears and pretzels.  She is worried about getting back to old dietary habits.  She made it clear that she doesn't want just another piece of paper emphasizing the importance of eating protein, that instead she is looking for help in figuring out triggers to what she feels is sometimes out-of-control eating and how to manage it.    As a child, Helen Paul experienced food restriction at both her mom's and dad's homes.  At her mom's, financial constraints meant few foods were even available, and at her dad's, her step-mother didn't allow eating between meals, and was very critical of large-sized bodies.  An eating behavior Helen Paul maintained from childhood until right before her bariatric surgery was to eat uncooked pasta, a food that wasn't missed when it was snuck out of the kitchen.     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  Work in recent months has been so busy that she usually has no lunch break.  She is allowed to bring food to eat at her desk, but she has not managed to pack foods for work.    Other relevant history:  Hip replcmt 2012.   Learning Readiness: Ready  Usual eating pattern includes 2 meals and 1-3 snacks per day.  Usual physical activity includes none currently.  Sleep: Used a C-Pap pre-surg, which she stopped using after weight loss, and thinks she actually needs it again,  but has not restarted using it.    24-hr recall: (Up at 6:15 AM) B (7 AM)-   1 c coffee, 1/2 c whole milk Snk (9:30)-   2 eggs over easy, 1/4 c home-fries Snk (11:45)-  1/4 bagel L (2 PM)-  Water (w/ fresh fruit) Snk (2:30)-  4-pk Belvita crackers Snk ( PM)-  4 oz Tootsie Rolls Snk (6 PM)-  1/4 pkg Gobstoppers D (7 PM)-  1 grilled chx thigh, salad, 1 tbsp drsng, 2 bites pork ribs, diet root beer Snk (9:30)-  3/4 large bag Cheetos Typical day? No.  Usually eats some candy, but less than yesterday's The Proct[employer] Gam[employer]ootsie Rolls.  Seldom gets thirsty.   Progress Towards Goal(s):  In progress.   Nutritional Diagnosis:  [place]-3.3 Overweight/obesity As related to energy balance.  As evidenced by BMI >39.    Intervention:  Nutrition education.  Handouts given during visit include:   After-Visit Summary (AVS)  Meal planning form  Goals sheet  Demonstrated degree of understanding via:  Teach Back  Barriers to learning/adherence to lifestyle change: Sugar cravings; hectic schedule and time constraints.  Monitoring/Evaluation:  Dietary intake, exercise, and body weight in 4 week(s).

## 2018-01-17 DIAGNOSIS — H60332 Swimmer's ear, left ear: Secondary | ICD-10-CM | POA: Diagnosis not present

## 2018-01-19 ENCOUNTER — Encounter: Payer: Self-pay | Admitting: Family Medicine

## 2018-01-19 ENCOUNTER — Telehealth: Payer: Self-pay

## 2018-01-19 ENCOUNTER — Encounter: Payer: Self-pay | Admitting: *Deleted

## 2018-01-19 ENCOUNTER — Ambulatory Visit (INDEPENDENT_AMBULATORY_CARE_PROVIDER_SITE_OTHER): Payer: BLUE CROSS/BLUE SHIELD | Admitting: Family Medicine

## 2018-01-19 VITALS — BP 126/84 | HR 83 | Temp 97.5°F | Resp 12 | Ht 65.0 in | Wt 230.4 lb

## 2018-01-19 DIAGNOSIS — K111 Hypertrophy of salivary gland: Secondary | ICD-10-CM | POA: Diagnosis not present

## 2018-01-19 DIAGNOSIS — H9203 Otalgia, bilateral: Secondary | ICD-10-CM

## 2018-01-19 DIAGNOSIS — H60503 Unspecified acute noninfective otitis externa, bilateral: Secondary | ICD-10-CM

## 2018-01-19 LAB — CBC WITH DIFFERENTIAL/PLATELET
Basophils Absolute: 0 10*3/uL (ref 0.0–0.1)
Basophils Relative: 0.3 % (ref 0.0–3.0)
Eosinophils Absolute: 0.1 10*3/uL (ref 0.0–0.7)
Eosinophils Relative: 0.6 % (ref 0.0–5.0)
HCT: 36.8 % (ref 36.0–46.0)
Hemoglobin: 12.3 g/dL (ref 12.0–15.0)
Lymphocytes Relative: 10.4 % — ABNORMAL LOW (ref 12.0–46.0)
Lymphs Abs: 1.1 10*3/uL (ref 0.7–4.0)
MCHC: 33.3 g/dL (ref 30.0–36.0)
MCV: 88.7 fl (ref 78.0–100.0)
Monocytes Absolute: 0.8 10*3/uL (ref 0.1–1.0)
Monocytes Relative: 7.4 % (ref 3.0–12.0)
Neutro Abs: 8.4 10*3/uL — ABNORMAL HIGH (ref 1.4–7.7)
Neutrophils Relative %: 81.3 % — ABNORMAL HIGH (ref 43.0–77.0)
Platelets: 326 10*3/uL (ref 150.0–400.0)
RBC: 4.15 Mil/uL (ref 3.87–5.11)
RDW: 13.7 % (ref 11.5–15.5)
WBC: 10.4 10*3/uL (ref 4.0–10.5)

## 2018-01-19 MED ORDER — CIPROFLOXACIN-DEXAMETHASONE 0.3-0.1 % OT SUSP: 4.0000 [drp] | Freq: Two times a day (BID) | OTIC | 0 refills | Status: AC

## 2018-01-19 MED ORDER — TRAMADOL HCL 50 MG PO TABS: 50.0000 mg | ORAL_TABLET | Freq: Three times a day (TID) | ORAL | 0 refills | Status: DC | PRN

## 2018-01-19 NOTE — Telephone Encounter (Signed)
This request would need to go to Dr. SwazilandJordan since she was the one who examined her

## 2018-01-19 NOTE — Telephone Encounter (Signed)
Sent to PCP to advise 

## 2018-01-19 NOTE — Telephone Encounter (Signed)
Copied from CRM [id number]. Topic: Quick Communication - See Telephone Encounter >> Jan 19, 2018  2:44 PM Herby AbrahamJohnson, Shiquita C wrote: CRM for notification. See Telephone encounter for: 01/19/18.  Pt was seen this morning a prescribed Tramadol. Pt's spouse says that medication is not helping pt's pain, she is in a great deal of pain. Pt and spouse is suggesting oxycodone instead.   Please advise.   Pharmacy: [facility] [id number] - [place], [place] - [address] [phone] (Phone) [phone] (Fax)

## 2018-01-19 NOTE — Progress Notes (Signed)
ACUTE VISIT  HPI:  Chief Complaint  Patient presents with  . Ear Pain    [name] is a 42 y.o.female here today complaining of 4 days of bilateral ear ache and facial edema.  Symptoms started in the left ear 4 days ago and right ear 2 days ago.   Whitish left ear drainage,clear drainage from right ear. + Hearing loss.  Achy severe pain that interferes with sleep. Ibuprofen was not helping. She took Percocet she had left, helped some. Pain has improved ,6/10, it was 9/10.  She was seen in urgent care on 01/17/18 and Dx with acute swimmer's ear, started on Augmentin and Neomycin-Polymycin ear drops. She has taken 4 doses of abx. Bilateral edema and tenderness on parotid gland area, no erythema.Pain is exacerbated by eating. Jaw and upper teeth ache.  Vaccination up to date,including MMR.   Left side is improving.    Otalgia   There is pain in both ears. This is a new problem. The current episode started in the past 7 days. The problem occurs constantly. The problem has been gradually improving. The fever has been present for 1 to 2 days. The pain is at a severity of 9/10. The pain is severe. Associated symptoms include coughing, ear discharge and hearing loss. Pertinent negatives include no abdominal pain, diarrhea, headaches, neck pain, rash, rhinorrhea, sore throat or vomiting. She has tried antibiotics, ear drops and NSAIDs for the symptoms. The treatment provided mild relief. There is no history of a chronic ear infection or a tympanostomy tube.    Subjective "low grade" fever No Hx of recent travel. No sick contact. No known insect bite.  She has not had sore throat,nasal congestion,or sinus pain.   Review of Systems  Constitutional: Positive for appetite change, fatigue and fever. Negative for activity change.  HENT: Positive for ear discharge, ear pain, facial swelling and hearing loss. Negative for congestion, mouth sores, rhinorrhea, sinus  pressure, sore throat, trouble swallowing and voice change.   Eyes: Negative for discharge, redness and itching.  Respiratory: Positive for cough. Negative for shortness of breath and wheezing.   Gastrointestinal: Negative for abdominal pain, diarrhea, nausea and vomiting.  Musculoskeletal: Negative for gait problem, myalgias and neck pain.  Skin: Negative for pallor and rash.  Allergic/Immunologic: Negative for environmental allergies.  Neurological: Negative for syncope, weakness, numbness and headaches.  Hematological: Negative for adenopathy. Does not bruise/bleed easily.  Psychiatric/Behavioral: Negative for confusion. The patient is nervous/anxious.       Current Outpatient Medications on File Prior to Visit  Medication Sig Dispense Refill  . amoxicillin-clavulanate (AUGMENTIN) 875-125 MG tablet Take by mouth.    . Cetirizine HCl (KLS ALLER-TEC PO) Take by mouth.    . Multiple Vitamins-Minerals (MULTIVITAMIN ADULT PO) Take by mouth daily.    Marland Kitchen venlafaxine XR (EFFEXOR-XR) 75 MG 24 hr capsule TAKE 1 CAPSULE (75 MG TOTAL) BY MOUTH DAILY WITH BREAKFAST. 90 capsule 0  . levonorgestrel (MIRENA, 52 MG,) 20 MCG/24HR IUD 1 Intra Uterine Device (1 each total) by Intrauterine route once. 1 each 0   No current facility-administered medications on file prior to visit.      Past Medical History:  Diagnosis Date  . Anxiety   . Avascular necrosis of hip (Levasy)   . Depression   . Hip pain, bilateral   . Hyperlipidemia   . Migraines   . Sleep apnea    per Dr. Gwenette Greet, uses CPAP    Allergies  Allergen Reactions  . Hydrocodone Nausea Only  . Other Rash    Z-PACK. Z-PACK  . Minocycline Other (See Comments)    Other reaction(s): Other Caused pressure in her head Caused pressure in her head   . Azithromycin Rash  . Hydrocodone-Acetaminophen Nausea Only  . Zithromax [Azithromycin Dihydrate] Rash    Social History   Socioeconomic History  . Marital status: Single    Spouse name:  Not on file  . Number of children: Not on file  . Years of education: Not on file  . Highest education level: Not on file  Occupational History  . Not on file  Social Needs  . Financial resource strain: Not on file  . Food insecurity:    Worry: Not on file    Inability: Not on file  . Transportation needs:    Medical: Not on file    Non-medical: Not on file  Tobacco Use  . Smoking status: Never Smoker  . Smokeless tobacco: Never Used  Substance and Sexual Activity  . Alcohol use: Yes    Alcohol/week: 0.0 oz    Comment: rare  . Drug use: No  . Sexual activity: Yes  Lifestyle  . Physical activity:    Days per week: Not on file    Minutes per session: Not on file  . Stress: Not on file  Relationships  . Social connections:    Talks on phone: Not on file    Gets together: Not on file    Attends religious service: Not on file    Active member of club or organization: Not on file    Attends meetings of clubs or organizations: Not on file    Relationship status: Not on file  Other Topics Concern  . Not on file  Social History Narrative  . Not on file    Vitals:   01/19/18 0905  BP: 126/84  Pulse: 83  Resp: 12  Temp: (!) 97.5 F (36.4 C)  SpO2: 97%   Body mass index is 38.34 kg/m.    Physical Exam  Nursing note and vitals reviewed. Constitutional: She is oriented to person, place, and time. She appears well-developed. She does not appear ill. No distress.  HENT:  Head: Normocephalic and atraumatic.  Right Ear: There is swelling and tenderness. No mastoid tenderness.  Left Ear: There is swelling and tenderness. No mastoid tenderness.  Nose: Right sinus exhibits no maxillary sinus tenderness and no frontal sinus tenderness. Left sinus exhibits no maxillary sinus tenderness and no frontal sinus tenderness.  Mouth/Throat: Oropharynx is clear and moist and mucous membranes are normal.  Whitish debris in ear canal, bilateral.  Not able to see TM. Mild tenderness  upon otoscopic evaluation. Macular erythema appreciated behind ears, anterior to mastoid.  No induration or local heat. Mild tenderness upon pulling auricular, bilateral. No pain elicited upon pressing tragus.  Gross hearing intact.  Mild edema on parotid gland,R>L. Minimal tenderness with palpation.No erythema.  Eyes: Pupils are equal, round, and reactive to light. Conjunctivae are normal.  Neck: No muscular tenderness present. No edema and no erythema present.  Cardiovascular: Normal rate and regular rhythm.  Murmur (Soft SEM RUSB) heard. Reporting Hx of heart murmur.  Respiratory: Effort normal and breath sounds normal. No stridor. No respiratory distress.  Lymphadenopathy:       Head (right side): No submandibular, no preauricular and no posterior auricular adenopathy present.       Head (left side): No submandibular, no preauricular and no posterior auricular  adenopathy present.    She has no cervical adenopathy.  Neurological: She is alert and oriented to person, place, and time. She has normal strength. No cranial nerve deficit. Gait normal.  Skin: Skin is warm. No rash noted. There is erythema (Retroauricular, bilateral.No edema).  Psychiatric: Her mood appears anxious.  Fairly groomed, good eye contact.      ASSESSMENT AND PLAN:   Ms. Siya was seen today for ear pain.  Diagnoses and all orders for this visit:  Lab Results  Component Value Date   WBC 10.4 01/19/2018   HGB 12.3 01/19/2018   HCT 36.8 01/19/2018   MCV 88.7 01/19/2018   PLT 326.0 01/19/2018    Acute otitis externa of both ears, unspecified type  Started abx treatment 36-48 hours ago, left ear seems to be improving. For now I recommend continuing Augmentin.   Stop Neomycin otic drops and start Ciprodex. Clearly instructed about warning signs.  -     CBC with Differential/Platelet -     ciprofloxacin-dexamethasone (CIPRODEX) OTIC suspension; Place 4 drops into both ears 2 (two) times daily for  10 days.  Earache symptoms in both ears  She is requesting something stronger for pain. Hydrocodone causes nausea. She has taken Tramadol before and well tolerated. We discussed some side effects and risk of interaction with Effexor. Can also take Acetaminophen 500 mg qid.  -     traMADol (ULTRAM) 50 MG tablet; Take 1 tablet (50 mg total) by mouth every 8 (eight) hours as needed for up to 5 days.  Enlarged parotid gland  Bilateral , left is improving. ? Viral parotiditis.  Clearly instructed about warning signs. F/U with PCP in 4 days.    -Ms. STEPHENE ALEGRIA was advised to seek attention immediately if symptoms worsen.She agrees with plan and voices understanding.        Raelie Lohr G. [place], MD  [facility]. [place] office.

## 2018-01-19 NOTE — Patient Instructions (Signed)
  [name] I have seen you today for an acute visit.  A few things to remember from today's visit:   Acute otitis externa of both ears, unspecified type - Plan: CBC with Differential/Platelet  Earache symptoms in both ears   Medications prescribed today are intended for short period of time and will not be refill upon request, a follow up appointment might be necessary to discuss continuation of of treatment if appropriate.     In general please monitor for signs of worsening symptoms and seek immediate medical attention if any concerning.    I hope you get better soon!

## 2018-01-20 ENCOUNTER — Other Ambulatory Visit: Payer: Self-pay | Admitting: Family Medicine

## 2018-01-20 MED ORDER — OXYCODONE-ACETAMINOPHEN 5-325 MG PO TABS: 1.0000 | ORAL_TABLET | Freq: Three times a day (TID) | ORAL | 0 refills | Status: AC | PRN

## 2018-01-20 NOTE — Telephone Encounter (Signed)
Rx for Percocet was sent to her pharmacy to take tid as needed. Please remind her to arrange appt for 4 days follow up with PCP,I do not see appt arranged yet. Also if swelling or pain gets worse or she develops fever (temp =/>100 F) she needs to seek immediate medical attention.  Thanks, BJ

## 2018-01-20 NOTE — Telephone Encounter (Signed)
Spoke with patient and informed her that Rx was sent to pharmacy. Patient stated that she has appointment with Dr. Clent RidgesFry on Friday. Patient states that she doesn't feel like she is getting any better.

## 2018-01-22 ENCOUNTER — Encounter: Payer: Self-pay | Admitting: Family Medicine

## 2018-01-22 ENCOUNTER — Ambulatory Visit (INDEPENDENT_AMBULATORY_CARE_PROVIDER_SITE_OTHER): Payer: BLUE CROSS/BLUE SHIELD | Admitting: Family Medicine

## 2018-01-22 VITALS — BP 96/60 | HR 99 | Temp 98.3°F | Ht 65.0 in | Wt 231.4 lb

## 2018-01-22 DIAGNOSIS — H60333 Swimmer's ear, bilateral: Secondary | ICD-10-CM

## 2018-01-22 DIAGNOSIS — H6121 Impacted cerumen, right ear: Secondary | ICD-10-CM

## 2018-01-22 NOTE — Progress Notes (Signed)
   Subjective:    Patient ID: Helen GandyMarian M Paul, female    DOB: 1975/10/04, 42 y.o.   MRN: [id number]  HPI Here to recheck a double swimmers ear infection. On 01-17-18 she went to urgent care with pain in both ears and she was given 10 days of Augmentin and Cortisporin drops. She did not improve much so she was seen here on 01-19-18. She was told to stay on Augmentin, but the drops were changed to Ciprodex. Since then she has improved quite a bit. The swelling in her face and ear lobes has resolved. Her left ear feels back to normal. The right ear still has a little pain but has gotten better. She still has trouble hearing out of the right ear.    Review of Systems  Constitutional: Negative.   HENT: Positive for ear discharge, ear pain and hearing loss. Negative for congestion, facial swelling, sinus pressure and sinus pain.   Eyes: Negative.   Respiratory: Negative.        Objective:   Physical Exam  Constitutional: She appears well-developed and well-nourished.  HENT:  Left Ear: External ear normal.  Nose: Nose normal.  Mouth/Throat: Oropharynx is clear and moist.  The left ear canal is full of cerumen.   Eyes: Conjunctivae are normal.  Neck: Neck supple. No thyromegaly present.  Pulmonary/Chest: Effort normal and breath sounds normal.          Assessment & Plan:  Bilateral otitis externa. This has resolved on the left side and is much improved on the right side. We irrigated the cerumen out of the right canal. She will use the drops 3-4 more days in the right ear. Recheck prn. Gershon CraneStephen Addisynn Vassell, MD

## 2018-01-26 DIAGNOSIS — F411 Generalized anxiety disorder: Secondary | ICD-10-CM | POA: Diagnosis not present

## 2018-02-04 ENCOUNTER — Other Ambulatory Visit: Payer: Self-pay | Admitting: Family Medicine

## 2018-02-09 DIAGNOSIS — F411 Generalized anxiety disorder: Secondary | ICD-10-CM | POA: Diagnosis not present

## 2018-02-23 DIAGNOSIS — F411 Generalized anxiety disorder: Secondary | ICD-10-CM | POA: Diagnosis not present

## 2018-03-04 ENCOUNTER — Encounter: Payer: Self-pay | Admitting: Family Medicine

## 2018-03-04 NOTE — Patient Instructions (Addendum)
Recommendations:  [employer]Yum website; Dario GuardianEllyn Satter's ellynsatterinstitute.org (Child of Mine book).   Use the Urge process as appropriate with those negative derailing thoughts.   Document progress on your behavior change goals.    Goals remain the same: 1. Eat at least 3 REAL meals and 1-2 snacks per day.  Aim for no more than 5 hours between eating.  Eat breakfast within one hour of getting up.  A REAL meal includes at least some protein, (some starch), and vegetables.   OR:  Would you serve this to a guest in your home, and call it a meal? 2. Set aside a few minutes to plan meals and snacks for the week, including groceries needed.   3. Physical activity: Walk at least 20 minutes three times a week.

## 2018-03-11 DIAGNOSIS — F411 Generalized anxiety disorder: Secondary | ICD-10-CM | POA: Diagnosis not present

## 2018-03-23 ENCOUNTER — Ambulatory Visit: Payer: BLUE CROSS/BLUE SHIELD | Admitting: Family Medicine

## 2018-03-25 DIAGNOSIS — F411 Generalized anxiety disorder: Secondary | ICD-10-CM | POA: Diagnosis not present

## 2018-04-14 DIAGNOSIS — F411 Generalized anxiety disorder: Secondary | ICD-10-CM | POA: Diagnosis not present

## 2018-04-20 ENCOUNTER — Ambulatory Visit: Payer: BLUE CROSS/BLUE SHIELD | Admitting: Family Medicine

## 2018-05-04 DIAGNOSIS — F411 Generalized anxiety disorder: Secondary | ICD-10-CM | POA: Diagnosis not present

## 2018-05-12 DIAGNOSIS — Z1231 Encounter for screening mammogram for malignant neoplasm of breast: Secondary | ICD-10-CM | POA: Diagnosis not present

## 2018-05-12 DIAGNOSIS — Z01419 Encounter for gynecological examination (general) (routine) without abnormal findings: Secondary | ICD-10-CM | POA: Diagnosis not present

## 2018-05-12 DIAGNOSIS — Z124 Encounter for screening for malignant neoplasm of cervix: Secondary | ICD-10-CM | POA: Diagnosis not present

## 2018-05-12 DIAGNOSIS — Z6839 Body mass index (BMI) 39.0-39.9, adult: Secondary | ICD-10-CM | POA: Diagnosis not present

## 2018-05-25 DIAGNOSIS — F411 Generalized anxiety disorder: Secondary | ICD-10-CM | POA: Diagnosis not present

## 2018-06-01 DIAGNOSIS — F411 Generalized anxiety disorder: Secondary | ICD-10-CM | POA: Diagnosis not present

## 2018-06-15 DIAGNOSIS — F411 Generalized anxiety disorder: Secondary | ICD-10-CM | POA: Diagnosis not present

## 2018-06-24 DIAGNOSIS — F411 Generalized anxiety disorder: Secondary | ICD-10-CM | POA: Diagnosis not present

## 2018-07-27 DIAGNOSIS — F411 Generalized anxiety disorder: Secondary | ICD-10-CM | POA: Diagnosis not present

## 2018-08-10 DIAGNOSIS — F411 Generalized anxiety disorder: Secondary | ICD-10-CM | POA: Diagnosis not present

## 2018-08-26 DIAGNOSIS — F411 Generalized anxiety disorder: Secondary | ICD-10-CM | POA: Diagnosis not present

## 2018-09-14 DIAGNOSIS — F411 Generalized anxiety disorder: Secondary | ICD-10-CM | POA: Diagnosis not present

## 2018-10-12 DIAGNOSIS — F411 Generalized anxiety disorder: Secondary | ICD-10-CM | POA: Diagnosis not present

## 2018-10-26 DIAGNOSIS — F411 Generalized anxiety disorder: Secondary | ICD-10-CM | POA: Diagnosis not present

## 2018-11-09 DIAGNOSIS — F411 Generalized anxiety disorder: Secondary | ICD-10-CM | POA: Diagnosis not present

## 2018-11-23 DIAGNOSIS — F411 Generalized anxiety disorder: Secondary | ICD-10-CM | POA: Diagnosis not present

## 2018-12-14 DIAGNOSIS — F411 Generalized anxiety disorder: Secondary | ICD-10-CM | POA: Diagnosis not present

## 2018-12-30 DIAGNOSIS — F411 Generalized anxiety disorder: Secondary | ICD-10-CM | POA: Diagnosis not present

## 2019-01-13 DIAGNOSIS — F411 Generalized anxiety disorder: Secondary | ICD-10-CM | POA: Diagnosis not present

## 2019-02-01 ENCOUNTER — Other Ambulatory Visit: Payer: Self-pay | Admitting: Family Medicine

## 2019-02-14 ENCOUNTER — Telehealth (INDEPENDENT_AMBULATORY_CARE_PROVIDER_SITE_OTHER): Payer: BC Managed Care – PPO | Admitting: Family Medicine

## 2019-02-14 ENCOUNTER — Encounter: Payer: Self-pay | Admitting: Family Medicine

## 2019-02-14 ENCOUNTER — Other Ambulatory Visit: Payer: Self-pay

## 2019-02-14 DIAGNOSIS — F418 Other specified anxiety disorders: Secondary | ICD-10-CM

## 2019-02-14 MED ORDER — VENLAFAXINE HCL ER 37.5 MG PO CP24: 37.5000 mg | ORAL_CAPSULE | Freq: Every day | ORAL | 0 refills | Status: DC

## 2019-02-14 MED ORDER — BUPROPION HCL ER (XL) 150 MG PO TB24: 150.0000 mg | ORAL_TABLET | Freq: Every day | ORAL | 2 refills | Status: DC

## 2019-02-14 NOTE — Progress Notes (Signed)
Virtual Visit via Video Note  I connected with the patient on 02/14/19 at  1:30 PM EDT by a video enabled telemedicine application and verified that I am speaking with the correct person using two identifiers.  Location patient: home Location provider:work or home office Persons participating in the virtual visit: patient, provider  I discussed the limitations of evaluation and management by telemedicine and the availability of in person appointments. The patient expressed understanding and agreed to proceed.   HPI: Here to discuss her Effexor XR. She has been on this most of the time for the past 10 years, and it has been helpful for her anxiety. However she has been gaining some weight, she feels a bit flat emotionally, and her libido has been diminishing. She thinks the medication is playing a role, and I tend to agree with her. She wants to try something else. She sleeps well.    ROS: See pertinent positives and negatives per HPI.  Past Medical History:  Diagnosis Date  . Anxiety   . Avascular necrosis of hip ([place])   . Depression   . Hip pain, bilateral   . Hyperlipidemia   . Migraines   . Sleep apnea    per Dr. Gwenette Greet, uses CPAP     Past Surgical History:  Procedure Laterality Date  . JOINT REPLACEMENT  03-18-11   left total hip, per Dr. Florian Buff at [facility]   . SLEEVE GASTROPLASTY  01-13-14   per Dr. Toney Rakes at [place]      Family History  Problem Relation Age of Onset  . Fibromyalgia Mother   . Chronic fatigue Mother   . Heart disease Mother   . Heart attack Mother   . Heart disease Father   . Hypertension Father   . Diabetes Father   . Heart failure Father   . Narcolepsy Father   . Heart attack Father   . Obesity Sister   . Obesity Brother      Current Outpatient Medications:  .  buPROPion (WELLBUTRIN XL) 150 MG 24 hr tablet, Take 1 tablet (150 mg total) by mouth daily., Disp: 30 tablet, Rfl: 2 .  Cetirizine HCl (KLS  ALLER-TEC PO), Take by mouth., Disp: , Rfl:  .  levonorgestrel (MIRENA, 52 MG,) 20 MCG/24HR IUD, 1 Intra Uterine Device (1 each total) by Intrauterine route once., Disp: 1 each, Rfl: 0 .  Multiple Vitamins-Minerals (MULTIVITAMIN ADULT PO), Take by mouth daily., Disp: , Rfl:  .  venlafaxine XR (EFFEXOR XR) 37.5 MG 24 hr capsule, Take 1 capsule (37.5 mg total) by mouth daily with breakfast., Disp: 30 capsule, Rfl: 0  EXAM:  VITALS per patient if applicable:  GENERAL: alert, oriented, appears well and in no acute distress  HEENT: atraumatic, conjunttiva clear, no obvious abnormalities on inspection of external nose and ears  NECK: normal movements of the head and neck  LUNGS: on inspection no signs of respiratory distress, breathing rate appears normal, no obvious gross SOB, gasping or wheezing  CV: no obvious cyanosis  MS: moves all visible extremities without noticeable abnormality  PSYCH/NEURO: pleasant and cooperative, no obvious depression or anxiety, speech and thought processing grossly intact  ASSESSMENT AND PLAN: She is having side effects from Effexor XR so we agreed to taper off this over this over the next 4 weeks. She will take Effexor XR 37.5 mg daily for 2 weeks, then takes this every other day for 2 weeks. At the same time she will  begin Wellbutrin XL 150 mg daily. Recheck in 6 weeks.  Gershon CraneStephen Annalysa Mohammad, MD  Discussed the following assessment and plan:  No diagnosis found.     I discussed the assessment and treatment plan with the patient. The patient was provided an opportunity to ask questions and all were answered. The patient agreed with the plan and demonstrated an understanding of the instructions.   The patient was advised to call back or seek an in-person evaluation if the symptoms worsen or if the condition fails to improve as anticipated.

## 2019-02-17 DIAGNOSIS — F411 Generalized anxiety disorder: Secondary | ICD-10-CM | POA: Diagnosis not present

## 2019-03-03 DIAGNOSIS — F411 Generalized anxiety disorder: Secondary | ICD-10-CM | POA: Diagnosis not present

## 2019-03-08 ENCOUNTER — Other Ambulatory Visit: Payer: Self-pay | Admitting: Family Medicine

## 2019-03-24 DIAGNOSIS — F411 Generalized anxiety disorder: Secondary | ICD-10-CM | POA: Diagnosis not present

## 2019-03-29 ENCOUNTER — Telehealth (INDEPENDENT_AMBULATORY_CARE_PROVIDER_SITE_OTHER): Payer: BC Managed Care – PPO | Admitting: Family Medicine

## 2019-03-29 ENCOUNTER — Encounter: Payer: Self-pay | Admitting: Family Medicine

## 2019-03-29 DIAGNOSIS — F418 Other specified anxiety disorders: Secondary | ICD-10-CM

## 2019-03-29 MED ORDER — SERTRALINE HCL 25 MG PO TABS: 25.0000 mg | ORAL_TABLET | Freq: Every day | ORAL | 2 refills | Status: DC

## 2019-03-29 NOTE — Progress Notes (Signed)
Virtual Visit via Video Note  I connected with the patient on 03/29/19 at  9:45 AM EDT by a video enabled telemedicine application and verified that I am speaking with the correct person using two identifiers.  Location patient: home Location provider:work or home office Persons participating in the virtual visit: patient, provider  I discussed the limitations of evaluation and management by telemedicine and the availability of in person appointments. The patient expressed understanding and agreed to proceed.   HPI: Here to follow up on anxiety and depression. Since we last spoke she has tapered herself off Effexor XR and has started Wellbutrin XL. In most ways she feels better, in that she has more energy, she is more motivated to get her work done on the job, and she is not crying all the time. However she now has more irritability to deal with. She tends to lose her temper with other people more quickly now. She is sleeping better and appetite is good.    ROS: See pertinent positives and negatives per HPI.  Past Medical History:  Diagnosis Date  . Anxiety   . Avascular necrosis of hip ([facility])   . Depression   . Hip pain, bilateral   . Hyperlipidemia   . Migraines   . Sleep apnea    per Dr. Shelle Iron, uses CPAP     Past Surgical History:  Procedure Laterality Date  . JOINT REPLACEMENT  03-18-11   left total hip, [facility] Dr. Nancy Fetter at [facility]   . SLEEVE GASTROPLASTY  01-13-14   per Dr. Lily Peer at [facility]   . WISDOM TOOTH EXTRACTION      Family History  Problem Relation Age of Onset  . Fibromyalgia Mother   . Chronic fatigue Mother   . Heart disease Mother   . Heart attack Mother   . Heart disease Father   . Hypertension Father   . Diabetes Father   . Heart failure Father   . Narcolepsy Father   . Heart attack Father   . Obesity Sister   . Obesity Brother      Current Outpatient Medications:  .  buPROPion (WELLBUTRIN XL) 150 MG 24 hr tablet, Take 1 tablet (150 mg  total) by mouth daily., Disp: 30 tablet, Rfl: 2 .  Cetirizine HCl (KLS ALLER-TEC PO), Take by mouth., Disp: , Rfl:  .  Multiple Vitamins-Minerals (MULTIVITAMIN ADULT PO), Take by mouth daily., Disp: , Rfl:  .  levonorgestrel (MIRENA, 52 MG,) 20 MCG/24HR IUD, 1 Intra Uterine Device (1 each total) by Intrauterine route once., Disp: 1 each, Rfl: 0 .  sertraline (ZOLOFT) 25 MG tablet, Take 1 tablet (25 mg total) by mouth daily., Disp: 30 tablet, Rfl: 2 .  venlafaxine XR (EFFEXOR XR) 37.5 MG 24 hr capsule, Take 1 capsule (37.5 mg total) by mouth daily with breakfast., Disp: 30 capsule, Rfl: 0  EXAM:  VITALS per patient if applicable:  GENERAL: alert, oriented, appears well and in no acute distress  HEENT: atraumatic, conjunttiva clear, no obvious abnormalities on inspection of external nose and ears  NECK: normal movements of the head and neck  LUNGS: on inspection no signs of respiratory distress, breathing rate appears normal, no obvious gross SOB, gasping or wheezing  CV: no obvious cyanosis  MS: moves all visible extremities without noticeable abnormality  PSYCH/NEURO: pleasant and cooperative, no obvious depression or anxiety, speech and thought processing grossly intact  ASSESSMENT AND PLAN: Anxiety and depression, we agreed to add a small dose of Zoloft (25  mg daily) to the Wellbutrin to ease the irritability. Recheck in 3 weeks.  Alysia Penna, MD  Discussed the following assessment and plan:  No diagnosis found.     I discussed the assessment and treatment plan with the patient. The patient was provided an opportunity to ask questions and all were answered. The patient agreed with the plan and demonstrated an understanding of the instructions.   The patient was advised to call back or seek an in-person evaluation if the symptoms worsen or if the condition fails to improve as anticipated.

## 2019-04-01 ENCOUNTER — Ambulatory Visit: Payer: BC Managed Care – PPO | Admitting: Family Medicine

## 2019-04-01 ENCOUNTER — Encounter: Payer: Self-pay | Admitting: Family Medicine

## 2019-04-04 NOTE — Telephone Encounter (Signed)
Please advise 

## 2019-04-06 NOTE — Telephone Encounter (Signed)
Please advise  I thought I routed this to you sorry!

## 2019-04-07 DIAGNOSIS — F411 Generalized anxiety disorder: Secondary | ICD-10-CM | POA: Diagnosis not present

## 2019-04-07 NOTE — Telephone Encounter (Signed)
I agree she should not start the Paxil yet and see how she does. She can certainly start it later if needed

## 2019-04-08 NOTE — Telephone Encounter (Signed)
Called pt to advise on Dr.Fry's advise. Pt verbalized understanding. No further information needed!

## 2019-04-20 ENCOUNTER — Other Ambulatory Visit: Payer: Self-pay | Admitting: Family Medicine

## 2019-04-21 DIAGNOSIS — F411 Generalized anxiety disorder: Secondary | ICD-10-CM | POA: Diagnosis not present

## 2019-05-09 ENCOUNTER — Other Ambulatory Visit: Payer: Self-pay | Admitting: Family Medicine

## 2019-05-12 DIAGNOSIS — F411 Generalized anxiety disorder: Secondary | ICD-10-CM | POA: Diagnosis not present

## 2019-05-19 DIAGNOSIS — F411 Generalized anxiety disorder: Secondary | ICD-10-CM | POA: Diagnosis not present

## 2019-06-06 DIAGNOSIS — F411 Generalized anxiety disorder: Secondary | ICD-10-CM | POA: Diagnosis not present

## 2019-06-16 DIAGNOSIS — F411 Generalized anxiety disorder: Secondary | ICD-10-CM | POA: Diagnosis not present

## 2019-06-22 DIAGNOSIS — Z01419 Encounter for gynecological examination (general) (routine) without abnormal findings: Secondary | ICD-10-CM | POA: Diagnosis not present

## 2019-06-22 DIAGNOSIS — Z124 Encounter for screening for malignant neoplasm of cervix: Secondary | ICD-10-CM | POA: Diagnosis not present

## 2019-06-22 DIAGNOSIS — Z1231 Encounter for screening mammogram for malignant neoplasm of breast: Secondary | ICD-10-CM | POA: Diagnosis not present

## 2019-06-22 DIAGNOSIS — Z6841 Body Mass Index (BMI) 40.0 and over, adult: Secondary | ICD-10-CM | POA: Diagnosis not present

## 2019-06-23 ENCOUNTER — Other Ambulatory Visit: Payer: Self-pay | Admitting: Obstetrics and Gynecology

## 2019-06-23 DIAGNOSIS — R928 Other abnormal and inconclusive findings on diagnostic imaging of breast: Secondary | ICD-10-CM

## 2019-06-28 ENCOUNTER — Ambulatory Visit
Admission: RE | Admit: 2019-06-28 | Discharge: 2019-06-28 | Disposition: A | Discharge status: Home / Self Care | Payer: BC Managed Care – PPO | Source: Ambulatory Visit | Attending: Obstetrics and Gynecology | Admitting: Obstetrics and Gynecology

## 2019-06-28 ENCOUNTER — Other Ambulatory Visit: Payer: Self-pay

## 2019-06-28 DIAGNOSIS — R928 Other abnormal and inconclusive findings on diagnostic imaging of breast: Secondary | ICD-10-CM

## 2019-06-28 DIAGNOSIS — N6002 Solitary cyst of left breast: Secondary | ICD-10-CM | POA: Diagnosis not present

## 2019-07-14 DIAGNOSIS — F411 Generalized anxiety disorder: Secondary | ICD-10-CM | POA: Diagnosis not present

## 2019-07-28 DIAGNOSIS — F411 Generalized anxiety disorder: Secondary | ICD-10-CM | POA: Diagnosis not present

## 2019-08-10 ENCOUNTER — Other Ambulatory Visit: Payer: Self-pay | Admitting: Family Medicine

## 2019-08-11 DIAGNOSIS — F411 Generalized anxiety disorder: Secondary | ICD-10-CM | POA: Diagnosis not present

## 2019-08-12 ENCOUNTER — Encounter: Payer: Self-pay | Admitting: Family Medicine

## 2019-08-16 MED ORDER — BUPROPION HCL ER (XL) 150 MG PO TB24: 150.0000 mg | ORAL_TABLET | Freq: Every day | ORAL | 3 refills | Status: DC

## 2019-08-16 NOTE — Telephone Encounter (Signed)
Done

## 2019-11-29 ENCOUNTER — Other Ambulatory Visit: Payer: Self-pay | Admitting: Family Medicine

## 2020-01-04 DIAGNOSIS — F411 Generalized anxiety disorder: Secondary | ICD-10-CM | POA: Diagnosis not present

## 2020-01-05 DIAGNOSIS — Z03818 Encounter for observation for suspected exposure to other biological agents ruled out: Secondary | ICD-10-CM | POA: Diagnosis not present

## 2020-01-05 DIAGNOSIS — Z20822 Contact with and (suspected) exposure to covid-19: Secondary | ICD-10-CM | POA: Diagnosis not present

## 2020-01-13 DIAGNOSIS — F411 Generalized anxiety disorder: Secondary | ICD-10-CM | POA: Diagnosis not present

## 2020-01-17 DIAGNOSIS — F411 Generalized anxiety disorder: Secondary | ICD-10-CM | POA: Diagnosis not present

## 2020-02-09 DIAGNOSIS — F411 Generalized anxiety disorder: Secondary | ICD-10-CM | POA: Diagnosis not present

## 2020-02-26 ENCOUNTER — Other Ambulatory Visit: Payer: Self-pay | Admitting: Family Medicine

## 2020-03-14 ENCOUNTER — Telehealth (INDEPENDENT_AMBULATORY_CARE_PROVIDER_SITE_OTHER): Payer: BC Managed Care – PPO | Admitting: Family Medicine

## 2020-03-14 ENCOUNTER — Encounter: Payer: Self-pay | Admitting: Family Medicine

## 2020-03-14 VITALS — Ht 65.0 in

## 2020-03-14 DIAGNOSIS — J019 Acute sinusitis, unspecified: Secondary | ICD-10-CM | POA: Diagnosis not present

## 2020-03-14 MED ORDER — AMOXICILLIN-POT CLAVULANATE 875-125 MG PO TABS: 1.0000 | ORAL_TABLET | Freq: Two times a day (BID) | ORAL | 0 refills | Status: DC

## 2020-03-14 NOTE — Progress Notes (Signed)
Subjective:    Patient ID: Helen Paul, female    DOB: 1975-09-05, 44 y.o.   MRN: [id number]  HPI Virtual Visit via Video Note  I connected with the patient on 03/14/20 at  1:45 PM EDT by a video enabled telemedicine application and verified that I am speaking with the correct person using two identifiers.  Location patient: home Location provider:work or home office Persons participating in the virtual visit: patient, provider  I discussed the limitations of evaluation and management by telemedicine and the availability of in person appointments. The patient expressed understanding and agreed to proceed.   HPI: Here for 3 days of sinus congestion, ear pressure, PND, and a dry cough. No fever or headache. No chest pain or SOB or body aches. No NVD. Taking Guaifenecin.    ROS: See pertinent positives and negatives per HPI.  Past Medical History:  Diagnosis Date  . Anxiety   . Avascular necrosis of hip ([facility])   . Depression   . Hip pain, bilateral   . Hyperlipidemia   . Migraines   . Sleep apnea    per Dr. Shelle Iron, uses CPAP     Past Surgical History:  Procedure Laterality Date  . JOINT REPLACEMENT  03-18-11   left total hip, per Dr. Nancy Fetter at [facility]   . SLEEVE GASTROPLASTY  01-13-14   per Dr. Lily Peer at [facility]   . WISDOM TOOTH EXTRACTION      Family History  Problem Relation Age of Onset  . Fibromyalgia Mother   . Chronic fatigue Mother   . Heart disease Mother   . Heart attack Mother   . Heart disease Father   . Hypertension Father   . Diabetes Father   . Heart failure Father   . Narcolepsy Father   . Heart attack Father   . Obesity Sister   . Obesity Brother      Current Outpatient Medications:  .  buPROPion (WELLBUTRIN XL) 150 MG 24 hr tablet, Take 1 tablet (150 mg total) by mouth daily., Disp: 90 tablet, Rfl: 3 .  Cetirizine HCl (KLS ALLER-TEC PO), Take by mouth., Disp: , Rfl:  .  Multiple Vitamins-Minerals (MULTIVITAMIN ADULT PO), Take by  mouth daily., Disp: , Rfl:  .  sertraline (ZOLOFT) 25 MG tablet, TAKE 1 TABLET BY MOUTH EVERY DAY, Disp: 90 tablet, Rfl: 0 .  levonorgestrel (MIRENA, 52 MG,) 20 MCG/24HR IUD, 1 Intra Uterine Device (1 each total) by Intrauterine route once., Disp: 1 each, Rfl: 0  EXAM:  VITALS per patient if applicable:  GENERAL: alert, oriented, appears well and in no acute distress  HEENT: atraumatic, conjunttiva clear, no obvious abnormalities on inspection of external nose and ears  NECK: normal movements of the head and neck  LUNGS: on inspection no signs of respiratory distress, breathing rate appears normal, no obvious gross SOB, gasping or wheezing  CV: no obvious cyanosis  MS: moves all visible extremities without noticeable abnormality  PSYCH/NEURO: pleasant and cooperative, no obvious depression or anxiety, speech and thought processing grossly intact  ASSESSMENT AND PLAN: Sinusitis, treat with Augmentin.  Gershon Crane, MD  Discussed the following assessment and plan:  No diagnosis found.     I discussed the assessment and treatment plan with the patient. The patient was provided an opportunity to ask questions and all were answered. The patient agreed with the plan and demonstrated an understanding of the instructions.   The patient was advised to call back or seek an in-person evaluation  if the symptoms worsen or if the condition fails to improve as anticipated.     Review of Systems     Objective:   Physical Exam        Assessment & Plan:

## 2020-05-21 DIAGNOSIS — F411 Generalized anxiety disorder: Secondary | ICD-10-CM | POA: Diagnosis not present

## 2020-05-27 ENCOUNTER — Other Ambulatory Visit: Payer: Self-pay | Admitting: Family Medicine

## 2020-05-30 DIAGNOSIS — F411 Generalized anxiety disorder: Secondary | ICD-10-CM | POA: Diagnosis not present

## 2020-06-05 DIAGNOSIS — F411 Generalized anxiety disorder: Secondary | ICD-10-CM | POA: Diagnosis not present

## 2020-06-14 DIAGNOSIS — F411 Generalized anxiety disorder: Secondary | ICD-10-CM | POA: Diagnosis not present

## 2020-07-31 DIAGNOSIS — Z20822 Contact with and (suspected) exposure to covid-19: Secondary | ICD-10-CM | POA: Diagnosis not present

## 2020-08-26 ENCOUNTER — Other Ambulatory Visit: Payer: Self-pay | Admitting: Family Medicine

## 2020-08-29 ENCOUNTER — Other Ambulatory Visit: Payer: Self-pay | Admitting: Family Medicine

## 2020-10-29 ENCOUNTER — Encounter: Payer: Self-pay | Admitting: Family Medicine

## 2020-10-30 ENCOUNTER — Encounter: Payer: Self-pay | Admitting: Family Medicine

## 2020-10-30 ENCOUNTER — Ambulatory Visit: Payer: BC Managed Care – PPO | Admitting: Family Medicine

## 2020-10-30 ENCOUNTER — Other Ambulatory Visit: Payer: Self-pay

## 2020-10-30 VITALS — BP 102/76 | HR 71 | Temp 98.3°F | Wt 240.6 lb

## 2020-10-30 DIAGNOSIS — J019 Acute sinusitis, unspecified: Secondary | ICD-10-CM

## 2020-10-30 MED ORDER — AMOXICILLIN-POT CLAVULANATE 875-125 MG PO TABS: 1.0000 | ORAL_TABLET | Freq: Two times a day (BID) | ORAL | 0 refills | Status: DC

## 2020-10-30 NOTE — Telephone Encounter (Signed)
Set up an OV for this  

## 2020-10-30 NOTE — Telephone Encounter (Signed)
Pt was seen  today by Dr Fry 

## 2020-10-30 NOTE — Telephone Encounter (Signed)
Patient is scheduled for this afternoon but wants to know if Dr. Clent Ridges can call her in something with out her having to come in the office.

## 2020-10-30 NOTE — Progress Notes (Signed)
   Subjective:    Patient ID: Helen Paul, female    DOB: 1976/02/26, 45 y.o.   MRN: [id number]  HPI Here for 3 days of facial pressure, PND, tender lymph noes under the jaw, and pain in the left ear. No fever or ST or cough or NVD or body aches. Drinking fluid and taking Aleve and Sudafed.    Review of Systems  Constitutional: Negative.   HENT: Positive for congestion, ear pain, postnasal drip and sinus pressure. Negative for sore throat.   Eyes: Negative.   Respiratory: Negative.        Objective:   Physical Exam Constitutional:      Appearance: Normal appearance. She is not ill-appearing.  HENT:     Right Ear: Tympanic membrane, ear canal and external ear normal.     Left Ear: External ear normal.     Ears:     Comments: Left TM is red, not swollen     Nose: Congestion present.     Mouth/Throat:     Pharynx: Oropharynx is clear.  Eyes:     Conjunctiva/sclera: Conjunctivae normal.  Neck:     Comments: Tender shotty left sided AC nodes  Pulmonary:     Effort: Pulmonary effort is normal.     Breath sounds: Normal breath sounds.  Neurological:     Mental Status: She is alert.           Assessment & Plan:  Sinusitis and early OM, treat with Augmentin for 10 days.  Gershon Crane, MD

## 2020-11-14 ENCOUNTER — Encounter: Payer: BC Managed Care – PPO | Admitting: Family Medicine

## 2020-11-20 ENCOUNTER — Other Ambulatory Visit: Payer: Self-pay

## 2020-11-21 ENCOUNTER — Encounter: Payer: Self-pay | Admitting: Family Medicine

## 2020-11-21 ENCOUNTER — Ambulatory Visit (INDEPENDENT_AMBULATORY_CARE_PROVIDER_SITE_OTHER): Payer: BC Managed Care – PPO | Admitting: Family Medicine

## 2020-11-21 VITALS — BP 102/78 | HR 78 | Temp 99.0°F | Ht 65.5 in | Wt 239.0 lb

## 2020-11-21 DIAGNOSIS — Z0000 Encounter for general adult medical examination without abnormal findings: Secondary | ICD-10-CM | POA: Diagnosis not present

## 2020-11-21 DIAGNOSIS — G4733 Obstructive sleep apnea (adult) (pediatric): Secondary | ICD-10-CM

## 2020-11-21 LAB — CBC WITH DIFFERENTIAL/PLATELET
Basophils Absolute: 0 10*3/uL (ref 0.0–0.1)
Basophils Relative: 0.7 % (ref 0.0–3.0)
Eosinophils Absolute: 0.2 10*3/uL (ref 0.0–0.7)
Eosinophils Relative: 3 % (ref 0.0–5.0)
HCT: 38 % (ref 36.0–46.0)
Hemoglobin: 12.7 g/dL (ref 12.0–15.0)
Lymphocytes Relative: 24.5 % (ref 12.0–46.0)
Lymphs Abs: 1.6 10*3/uL (ref 0.7–4.0)
MCHC: 33.3 g/dL (ref 30.0–36.0)
MCV: 86.2 fl (ref 78.0–100.0)
Monocytes Absolute: 0.4 10*3/uL (ref 0.1–1.0)
Monocytes Relative: 5.8 % (ref 3.0–12.0)
Neutro Abs: 4.2 10*3/uL (ref 1.4–7.7)
Neutrophils Relative %: 66 % (ref 43.0–77.0)
Platelets: 333 10*3/uL (ref 150.0–400.0)
RBC: 4.4 Mil/uL (ref 3.87–5.11)
RDW: 14.3 % (ref 11.5–15.5)
WBC: 6.4 10*3/uL (ref 4.0–10.5)

## 2020-11-21 LAB — HEPATIC FUNCTION PANEL
ALT: 14 U/L (ref 0–35)
AST: 17 U/L (ref 0–37)
Albumin: 4.2 g/dL (ref 3.5–5.2)
Alkaline Phosphatase: 77 U/L (ref 39–117)
Bilirubin, Direct: 0.1 mg/dL (ref 0.0–0.3)
Total Bilirubin: 0.7 mg/dL (ref 0.2–1.2)
Total Protein: 6.8 g/dL (ref 6.0–8.3)

## 2020-11-21 LAB — HEMOGLOBIN A1C: Hgb A1c MFr Bld: 5.8 % (ref 4.6–6.5)

## 2020-11-21 LAB — TSH: TSH: 1.95 u[IU]/mL (ref 0.35–4.50)

## 2020-11-21 LAB — LIPID PANEL
Cholesterol: 205 mg/dL — ABNORMAL HIGH (ref 0–200)
HDL: 48.5 mg/dL (ref >39.00)
LDL Cholesterol: 130 mg/dL — ABNORMAL HIGH (ref 0–99)
NonHDL: 156.71
Total CHOL/HDL Ratio: 4
Triglycerides: 132 mg/dL (ref 0.0–149.0)
VLDL: 26.4 mg/dL (ref 0.0–40.0)

## 2020-11-21 LAB — BASIC METABOLIC PANEL
BUN: 13 mg/dL (ref 6–23)
CO2: 27 mEq/L (ref 19–32)
Calcium: 9.4 mg/dL (ref 8.4–10.5)
Chloride: 103 mEq/L (ref 96–112)
Creatinine, Ser: 0.78 mg/dL (ref 0.40–1.20)
GFR: 91.9 mL/min (ref >60.00)
Glucose, Bld: 92 mg/dL (ref 70–99)
Potassium: 4.3 mEq/L (ref 3.5–5.1)
Sodium: 139 mEq/L (ref 135–145)

## 2020-11-21 LAB — T4, FREE: Free T4: 0.64 ng/dL (ref 0.60–1.60)

## 2020-11-21 LAB — T3, FREE: T3, Free: 3.6 pg/mL (ref 2.3–4.2)

## 2020-11-21 MED ORDER — ALPRAZOLAM 0.5 MG PO TABS: 0.5000 mg | ORAL_TABLET | Freq: Two times a day (BID) | ORAL | 5 refills | Status: DC | PRN

## 2020-11-21 MED ORDER — SERTRALINE HCL 25 MG PO TABS
25.0000 mg | ORAL_TABLET | Freq: Every day | ORAL | 3 refills | Status: DC
Start: 2020-11-21 — End: 2021-11-19

## 2020-11-21 MED ORDER — BUPROPION HCL ER (XL) 300 MG PO TB24: 300.0000 mg | ORAL_TABLET | Freq: Every day | ORAL | 3 refills | Status: DC

## 2020-11-21 NOTE — Progress Notes (Signed)
Subjective:    Patient ID: Helen Paul, female    DOB: 28-Jan-1976, 45 y.o.   MRN: [id number]  HPI Here for a well exam and for several issues. First she complains of chronic pain in the neck and upper back, and she is certain this is the result of having very large breasts. She has always had large breasts, and she often feels herself being pulled forward at the waist and having to resist the weight of her breasts. She is interested in having these surgically reduced. Also she has been dealing with a lot of stress lately with family issues, and she asks about changing her medications. She often has trouble sleeping because she worries about things. Also she was found to have sleep apnea over 10 years ago, and she had a CPAP machine at one point. She stopped using it however and she lost of some years ago. She thinks the problem is getting worse because her husband says she snores more than ever, and she often finds herself falling asleep throughout the day. She is dieting to try to lose weight.    Review of Systems  Constitutional: Negative.   HENT: Negative.   Eyes: Negative.   Respiratory: Negative.   Cardiovascular: Negative.   Gastrointestinal: Negative.   Genitourinary: Negative for decreased urine volume, difficulty urinating, dyspareunia, dysuria, enuresis, flank pain, frequency, hematuria, pelvic pain and urgency.  Musculoskeletal: Positive for back pain and neck pain.  Skin: Negative.   Neurological: Negative.   Psychiatric/Behavioral: Positive for decreased concentration and sleep disturbance. Negative for agitation, behavioral problems, confusion, dysphoric mood and hallucinations. The patient is nervous/anxious.        Objective:   Physical Exam Constitutional:      General: She is not in acute distress.    Appearance: She is well-developed. She is obese.  HENT:     Head: Normocephalic and atraumatic.     Right Ear: External ear normal.     Left Ear: External ear  normal.     Nose: Nose normal.     Mouth/Throat:     Pharynx: No oropharyngeal exudate.  Eyes:     General: No scleral icterus.    Conjunctiva/sclera: Conjunctivae normal.     Pupils: Pupils are equal, round, and reactive to light.  Neck:     Thyroid: No thyromegaly.     Vascular: No JVD.  Cardiovascular:     Rate and Rhythm: Normal rate and regular rhythm.     Heart sounds: Normal heart sounds. No murmur heard. No friction rub. No gallop.   Pulmonary:     Effort: Pulmonary effort is normal. No respiratory distress.     Breath sounds: Normal breath sounds. No wheezing or rales.     Comments: She has very large breasts  Chest:     Chest wall: No tenderness.  Abdominal:     General: Bowel sounds are normal. There is no distension.     Palpations: Abdomen is soft. There is no mass.     Tenderness: There is no abdominal tenderness. There is no guarding or rebound.  Musculoskeletal:        General: No tenderness. Normal range of motion.     Cervical back: Normal range of motion and neck supple.  Lymphadenopathy:     Cervical: No cervical adenopathy.  Skin:    General: Skin is warm and dry.     Findings: No erythema or rash.  Neurological:     Mental Status: She is  alert and oriented to person, place, and time.     Cranial Nerves: No cranial nerve deficit.     Motor: No abnormal muscle tone.     Coordination: Coordination normal.     Deep Tendon Reflexes: Reflexes are normal and symmetric. Reflexes normal.  Psychiatric:        Mood and Affect: Mood normal.        Behavior: Behavior normal.        Thought Content: Thought content normal.        Judgment: Judgment normal.           Assessment & Plan:  Well exam. We discussed diet and exercise. Get fasting labs. There is no doubt that her neck and upper back pain are coming from her large breasts. She will try to lose some weight and we will monitor this situation. She may require breast reduction surgery at some point. For  the depression with anxiety we will keep the Zoloft at 25 mg daily, but we will increase the Wellbutrin XL to 300 mg daily. Add Xanax 0.5 mg to use as needed. We will refer her back to Pulmonology to reassess the sleep apnea.  Gershon Crane, MD

## 2021-01-04 ENCOUNTER — Ambulatory Visit (INDEPENDENT_AMBULATORY_CARE_PROVIDER_SITE_OTHER): Payer: BC Managed Care – PPO | Admitting: Pulmonary Disease

## 2021-01-04 ENCOUNTER — Other Ambulatory Visit: Payer: Self-pay

## 2021-01-04 ENCOUNTER — Encounter: Payer: Self-pay | Admitting: Pulmonary Disease

## 2021-01-04 VITALS — BP 112/74 | HR 68 | Temp 98.5°F | Ht 65.0 in | Wt 236.5 lb

## 2021-01-04 DIAGNOSIS — R0683 Snoring: Secondary | ICD-10-CM

## 2021-01-04 NOTE — Patient Instructions (Signed)
Can try using nasal irrigation (saline nasal spray) daily to help clear your sinus  Can use mucinex for productive cough or delsym for a dry cough  Will arrange for home sleep study  Will call to arrange for follow up after sleep study reviewed

## 2021-01-04 NOTE — Progress Notes (Signed)
Pulmonary, Critical Care, and Sleep Medicine  Chief Complaint  Patient presents with   Consult    Snores, has a sleep test 10 years ago, weight fluctuation, wakes up tired.   Also wanted to have her cough checked out.  Cough that comes and goes. Child is sick, husband is on abx for URI.      Constitutional:  BP 112/74   Pulse 68   Temp 98.5 F (36.9 C) (Oral)   Ht 5\' 5"  (1.651 m)   Wt 236 lb 8 oz (107.3 kg)   SpO2 98%   BMI 39.36 kg/m   Past Medical History:  Anxiety, Depression, HLD, Migraine headaches, Avascular necrosis of hip  Past Surgical History:  She  has a past surgical history that includes Wisdom tooth extraction; Joint replacement (03-18-11); and Sleeve Gastroplasty (01-13-14).  Brief Summary:  Helen Paul is a 45 y.o. female with snoring.      Subjective:   She was seen by Dr. 54 in 2012.  She had sleep study done that showed moderate sleep apnea.  She then had bariatric surgery and lost weight.  Her sleep improved and she transitioned off CPAP.  She later got pregnant and regained some of her weight.  With this her sleep got worse.  She is snoring more and wakes up hearing herself snore.  Her husband says she stops breathing while asleep.  She falls asleep while reading a book or watching TV.  She goes to sleep at 1030 pm.  She falls asleep in few minutes.  She wakes up some times to use the bathroom.  She gets out of bed at 645 am.  She feels always feels tired in the morning.  She denies morning headache.  She does not use anything to help her stay awake.  She will occasionally uses xanax to help sleep.  She denies sleep walking, sleep talking, bruxism, or nightmares.  There is no history of restless legs.  She denies sleep hallucinations, sleep paralysis, or cataplexy.  The Epworth score is 8 out of 24.  He picked up a virus from her kid about a week ago.  Has some sinus drainage and cough.  No fever, chest pain, dyspnea.  Physical Exam:    Appearance - well kempt   ENMT - no sinus tenderness, no oral exudate, no LAN, Mallampati 3 airway, no stridor, deviated septum  Respiratory - equal breath sounds bilaterally, no wheezing or rales  CV - s1s2 regular rate and rhythm, no murmurs  Ext - no clubbing, no edema  Skin - no rashes  Psych - normal mood and affect   Sleep Tests:  PSG 03/02/11 >> AHI 25, SpO2 84%  Social History:  She  reports that she has never smoked. She has never used smokeless tobacco. She reports current alcohol use. She reports that she does not use drugs.  Family History:  Her family history includes Chronic fatigue in her mother; Diabetes in her father; Fibromyalgia in her mother; Heart attack in her father and mother; Heart disease in her father and mother; Heart failure in her father; Hypertension in her father; Narcolepsy in her father; Obesity in her brother and sister.    Discussion:  She has snoring, sleep disruption, apnea, and daytime sleepiness.  She has history of depression.  Her sleep apnea has likely recurred after she regained weight.  Assessment/Plan:   Snoring with excessive daytime sleepiness. - will need to arrange for a home sleep study  Deviated  nasal septum with post nasal drip. - she seems to be recovering from recent viral upper respiratory infection - don't think she needs antibiotics at this time - advised her to try using nasal irrigation - prn mucinex or delsym for cough - she will defer trip to visit her brother (who is under treatment for cancer) in [place] until she has fully recovered  Obesity. - discussed how weight can impact sleep and risk for sleep disordered breathing - discussed options to assist with weight loss: combination of diet modification, cardiovascular and strength training exercises  Cardiovascular risk. - had an extensive discussion regarding the adverse health consequences related to untreated sleep disordered breathing - specifically  discussed the risks for hypertension, coronary artery disease, cardiac dysrhythmias, cerebrovascular disease, and diabetes - lifestyle modification discussed  Safe driving practices. - discussed how sleep disruption can increase risk of accidents, particularly when driving - safe driving practices were discussed  Therapies for obstructive sleep apnea. - if the sleep study shows significant sleep apnea, then various therapies for treatment were reviewed: CPAP, oral appliance, and surgical interventions  Time Spent Involved in Patient Care on Day of Examination:  35 minutes  Follow up:   Patient Instructions  Can try using nasal irrigation (saline nasal spray) daily to help clear your sinus  Can use mucinex for productive cough or delsym for a dry cough  Will arrange for home sleep study  Will call to arrange for follow up after sleep study reviewed  Medication List:   Allergies as of 01/04/2021       Reactions   Hydrocodone Nausea Only   Other Rash   Z-PACK. Z-PACK   Minocycline Other (See Comments)   Other reaction(s): Other Caused pressure in her head Caused pressure in her head   Azithromycin Rash   Hydrocodone-acetaminophen Nausea Only   Zithromax [azithromycin Dihydrate] Rash        Medication List        Accurate as of January 04, 2021 12:07 PM. If you have any questions, ask your nurse or doctor.          ALPRAZolam 0.5 MG tablet Commonly known as: Xanax Take 1 tablet (0.5 mg total) by mouth 2 (two) times daily as needed for anxiety.   buPROPion 300 MG 24 hr tablet Commonly known as: Wellbutrin XL Take 1 tablet (300 mg total) by mouth daily.   KLS ALLER-TEC PO Take by mouth as needed.   levonorgestrel 20 MCG/24HR IUD Commonly known as: Mirena (52 MG) 1 Intra Uterine Device (1 each total) by Intrauterine route once.   sertraline 25 MG tablet Commonly known as: ZOLOFT Take 1 tablet (25 mg total) by mouth daily.   vitamin B-12 100 MCG  tablet Commonly known as: CYANOCOBALAMIN Vitamin B12        Signature:  Coralyn Helling, MD [facility] Pager - [phone] 01/04/2021, 12:07 PM

## 2021-02-04 ENCOUNTER — Ambulatory Visit: Payer: BC Managed Care – PPO

## 2021-02-04 ENCOUNTER — Other Ambulatory Visit: Payer: Self-pay

## 2021-02-04 DIAGNOSIS — R0683 Snoring: Secondary | ICD-10-CM

## 2021-02-05 ENCOUNTER — Telehealth: Payer: Self-pay | Admitting: Pulmonary Disease

## 2021-02-05 NOTE — Telephone Encounter (Signed)
HST 02/04/21 >> AHI 17.6, SpO2 low 79%  Please inform her that her sleep study shows moderate obstructive sleep apnea.  Please arrange for ROV with me or NP to discuss treatment options.

## 2021-02-06 NOTE — Telephone Encounter (Signed)
ATC patient to go over HST results, LMTCB 

## 2021-02-07 NOTE — Telephone Encounter (Signed)
ATC patient to go over sleep study results, LMTCB. Also sent letter in mail. Will close encounter.

## 2021-02-08 ENCOUNTER — Telehealth: Payer: Self-pay | Admitting: Pulmonary Disease

## 2021-02-08 NOTE — Telephone Encounter (Signed)
Attempted to contact patient, voicemail is full.

## 2021-02-11 DIAGNOSIS — F411 Generalized anxiety disorder: Secondary | ICD-10-CM | POA: Diagnosis not present

## 2021-02-12 ENCOUNTER — Telehealth: Payer: Self-pay | Admitting: Pulmonary Disease

## 2021-02-12 NOTE — Telephone Encounter (Signed)
Will need to call the pt in the morning on 08/10

## 2021-02-13 NOTE — Telephone Encounter (Signed)
Pt returned call. Informed her of the results of HST per Dr. Craige Cotta (see below). Appt scheduled to see TP on 8/18. Pt verbalized understanding and denied any further questions or concerns at this time.     Per Dr. Craige Cotta: HST 02/04/21 >> AHI 17.6, SpO2 low 79%   Please inform her that her sleep study shows moderate obstructive sleep apnea.  Please arrange for ROV with me or NP to discuss treatment options.

## 2021-02-21 ENCOUNTER — Ambulatory Visit: Payer: BC Managed Care – PPO | Admitting: Adult Health

## 2021-02-22 DIAGNOSIS — F411 Generalized anxiety disorder: Secondary | ICD-10-CM | POA: Diagnosis not present

## 2021-03-04 DIAGNOSIS — F411 Generalized anxiety disorder: Secondary | ICD-10-CM | POA: Diagnosis not present

## 2021-03-05 ENCOUNTER — Other Ambulatory Visit: Payer: Self-pay

## 2021-03-05 ENCOUNTER — Encounter: Payer: Self-pay | Admitting: Adult Health

## 2021-03-05 ENCOUNTER — Ambulatory Visit: Payer: BC Managed Care – PPO | Admitting: Adult Health

## 2021-03-05 VITALS — BP 118/74 | HR 80 | Temp 98.7°F | Ht 65.0 in | Wt 232.4 lb

## 2021-03-05 DIAGNOSIS — R0683 Snoring: Secondary | ICD-10-CM | POA: Diagnosis not present

## 2021-03-05 DIAGNOSIS — G4733 Obstructive sleep apnea (adult) (pediatric): Secondary | ICD-10-CM | POA: Diagnosis not present

## 2021-03-05 NOTE — Assessment & Plan Note (Signed)
Healthy weight loss discussed 

## 2021-03-05 NOTE — Progress Notes (Signed)
@Patient  ID: , female    DOB: 07-07-1976, 45 y.o.   MRN: [id number]  Chief Complaint  Patient presents with   Follow-up    Referring provider: [id number], MD  HPI: 45 year old female seen for sleep consult January 04, 2021 to reestablish for sleep apnea  TEST/EVENTS :   03/05/2021 Follow up : OSA  Patient returns for a follow-up visit.  She was seen last month to reestablish for sleep apnea.  Patient been diagnosed with sleep apnea about 10 years ago.  Wore CPAP for short period time.  She had bariatric weight loss surgery.  Was able to transition off of CPAP for short period of time.  Patient continued to have ongoing daytime sleepiness and restless sleep.  She was set up for home sleep study that was completed on February 04, 2021 that showed moderate sleep apnea with AHI 17.6/hour, SPO2 low 79%.  We discussed underlying sleep apnea with patient education.  Went over treatment options including weight loss, oral appliance and CPAP.  Patient would like to proceed with CPAP therapy.   Allergies  Allergen Reactions   Hydrocodone Nausea Only   Other Rash    Z-PACK. Z-PACK   Minocycline Other (See Comments)    Other reaction(s): Other Caused pressure in her head Caused pressure in her head    Azithromycin Rash   Hydrocodone-Acetaminophen Nausea Only   Zithromax [Azithromycin Dihydrate] Rash    Immunization History  Administered Date(s) Administered   Influenza Inj Mdck Quad With Preservative 03/05/2020   Influenza Split 05/07/2011, 04/04/2013   Influenza,inj,Quad PF,6+ Mos 06/26/2015   Influenza-Unspecified 06/07/2015, 05/02/2016, 04/06/2017, 04/07/2019   PFIZER Comirnaty(Gray Top)Covid-19 Tri-Sucrose Vaccine 10/14/2019, 11/07/2019, 06/09/2020   Tdap 06/13/2013, 12/13/2015    Past Medical History:  Diagnosis Date   Anxiety    Avascular necrosis of hip (HCC)    Depression    Hip pain, bilateral    Hyperlipidemia    Migraines    Sleep apnea     Tobacco  History: Social History   Tobacco Use  Smoking Status Never  Smokeless Tobacco Never   Counseling given: Not Answered   Outpatient Medications Prior to Visit  Medication Sig Dispense Refill   ALPRAZolam (XANAX) 0.5 MG tablet Take 1 tablet (0.5 mg total) by mouth 2 (two) times daily as needed for anxiety. 60 tablet 5   buPROPion (WELLBUTRIN XL) 300 MG 24 hr tablet Take 1 tablet (300 mg total) by mouth daily. 90 tablet 3   Cetirizine HCl (KLS ALLER-TEC PO) Take by mouth as needed.     levonorgestrel (MIRENA, 52 MG,) 20 MCG/24HR IUD 1 Intra Uterine Device (1 each total) by Intrauterine route once. 1 each 0   sertraline (ZOLOFT) 25 MG tablet Take 1 tablet (25 mg total) by mouth daily. 90 tablet 3   vitamin B-12 (CYANOCOBALAMIN) 100 MCG tablet Vitamin B12     No facility-administered medications prior to visit.     Review of Systems:   Constitutional:   No  weight loss, night sweats,  Fevers, chills,  +fatigue, or  lassitude.  HEENT:   No headaches,  Difficulty swallowing,  Tooth/dental problems, or  Sore throat,                No sneezing, itching, ear ache, nasal congestion, post nasal drip,   CV:  No chest pain,  Orthopnea, PND, swelling in lower extremities, anasarca, dizziness, palpitations, syncope.   GI  No heartburn, indigestion, abdominal pain, nausea, vomiting, diarrhea,  change in bowel habits, loss of appetite, bloody stools.   Resp: No shortness of breath with exertion or at rest.  No excess mucus, no productive cough,  No non-productive cough,  No coughing up of blood.  No change in color of mucus.  No wheezing.  No chest wall deformity  Skin: no rash or lesions.  GU: no dysuria, change in color of urine, no urgency or frequency.  No flank pain, no hematuria   MS:  No joint pain or swelling.  No decreased range of motion.  No back pain.    Physical Exam  BP 118/74 (BP Location: Left Arm, Patient Position: Sitting, Cuff Size: Normal)   Pulse 80   Temp 98.7 F  (37.1 C) (Oral)   Ht 5\' 5"  (1.651 m)   Wt 232 lb 6.4 oz (105.4 kg)   SpO2 99%   BMI 38.67 kg/m   GEN: A/Ox3; pleasant , NAD, well nourished    HEENT:  [place]/AT,   NOSE-clear, THROAT-clear, no lesions, no postnasal drip or exudate noted.  Class II-III MP airway   NECK:  Supple w/ fair ROM; no JVD; normal carotid impulses w/o bruits; no thyromegaly or nodules palpated; no lymphadenopathy.    RESP  Clear  P & A; w/o, wheezes/ rales/ or rhonchi. no accessory muscle use, no dullness to percussion  CARD:  RRR, no m/r/g, no peripheral edema, pulses intact, no cyanosis or clubbing.  GI:   Soft & nt; nml bowel sounds; no organomegaly or masses detected.   Musco: Warm bil, no deformities or joint swelling noted.   Neuro: alert, no focal deficits noted.    Skin: Warm, no lesions or rashes    Lab Results:  CBC    Component Value Date/Time   WBC 6.4 11/21/2020 0952   RBC 4.40 11/21/2020 0952   HGB 12.7 11/21/2020 0952   HCT 38.0 11/21/2020 0952   PLT 333.0 11/21/2020 0952   MCV 86.2 11/21/2020 0952   MCH 29.8 03/08/2016 0557   MCHC 33.3 11/21/2020 0952   RDW 14.3 11/21/2020 0952   LYMPHSABS 1.6 11/21/2020 0952   MONOABS 0.4 11/21/2020 0952   EOSABS 0.2 11/21/2020 0952   BASOSABS 0.0 11/21/2020 0952    BMET    Component Value Date/Time   NA 139 11/21/2020 0952   K 4.3 11/21/2020 0952   CL 103 11/21/2020 0952   CO2 27 11/21/2020 0952   GLUCOSE 92 11/21/2020 0952   BUN 13 11/21/2020 0952   CREATININE 0.78 11/21/2020 0952   CALCIUM 9.4 11/21/2020 0952    BNP No results found for: BNP  ProBNP No results found for: PROBNP  Imaging: No results found.    No flowsheet data found.  No results found for: NITRICOXIDE      Assessment & Plan:   OSA (obstructive sleep apnea) Moderate obstructive sleep apnea.  Patient education on sleep apnea and treatment options including CPAP. We will begin CPAP therapy AutoSet 5 to 15 cm H2O.  Mask of choice.  Enroll in  [facility].  Plan  Patient Instructions  Begin CPAP At bedtime   Wear CPAP At bedtime  all night , for at least 4-6 hrs .  Work on healthy weight.  Do not drive if sleepy  Follow up with Dr. 11/23/2020  in 3 months and As needed        Morbid obesity Healthy weight loss discussed     Craige Cotta, NP 03/05/2021

## 2021-03-05 NOTE — Assessment & Plan Note (Signed)
Moderate obstructive sleep apnea.  Patient education on sleep apnea and treatment options including CPAP. We will begin CPAP therapy AutoSet 5 to 15 cm H2O.  Mask of choice.  Enroll in [employer].  Plan  Patient Instructions  Begin CPAP At bedtime   Wear CPAP At bedtime  all night , for at least 4-6 hrs .  Work on healthy weight.  Do not drive if sleepy  Follow up with Dr. Craige Cotta  in 3 months and As needed

## 2021-03-05 NOTE — Patient Instructions (Signed)
Begin CPAP At bedtime   Wear CPAP At bedtime  all night , for at least 4-6 hrs .  Work on healthy weight.  Do not drive if sleepy  Follow up with Dr. Craige Cotta  in 3 months and As needed

## 2021-03-06 NOTE — Progress Notes (Signed)
Reviewed and agree with assessment/plan.   Coralyn Helling, MD [facility] Pulmonary/Critical Care 03/06/2021, 9:03 AM Pager:  [phone]

## 2021-03-18 DIAGNOSIS — F411 Generalized anxiety disorder: Secondary | ICD-10-CM | POA: Diagnosis not present

## 2021-03-25 DIAGNOSIS — R400 Somnolence: Secondary | ICD-10-CM | POA: Diagnosis not present

## 2021-03-25 DIAGNOSIS — R0683 Snoring: Secondary | ICD-10-CM | POA: Diagnosis not present

## 2021-03-25 DIAGNOSIS — G4733 Obstructive sleep apnea (adult) (pediatric): Secondary | ICD-10-CM | POA: Diagnosis not present

## 2021-04-01 DIAGNOSIS — F411 Generalized anxiety disorder: Secondary | ICD-10-CM | POA: Diagnosis not present

## 2021-04-15 DIAGNOSIS — Z6839 Body mass index (BMI) 39.0-39.9, adult: Secondary | ICD-10-CM | POA: Diagnosis not present

## 2021-04-15 DIAGNOSIS — F411 Generalized anxiety disorder: Secondary | ICD-10-CM | POA: Diagnosis not present

## 2021-04-15 DIAGNOSIS — Z01419 Encounter for gynecological examination (general) (routine) without abnormal findings: Secondary | ICD-10-CM | POA: Diagnosis not present

## 2021-04-24 DIAGNOSIS — R0683 Snoring: Secondary | ICD-10-CM | POA: Diagnosis not present

## 2021-04-24 DIAGNOSIS — R400 Somnolence: Secondary | ICD-10-CM | POA: Diagnosis not present

## 2021-04-24 DIAGNOSIS — G4733 Obstructive sleep apnea (adult) (pediatric): Secondary | ICD-10-CM | POA: Diagnosis not present

## 2021-04-29 DIAGNOSIS — F411 Generalized anxiety disorder: Secondary | ICD-10-CM | POA: Diagnosis not present

## 2021-05-13 DIAGNOSIS — F411 Generalized anxiety disorder: Secondary | ICD-10-CM | POA: Diagnosis not present

## 2021-05-23 DIAGNOSIS — Z1231 Encounter for screening mammogram for malignant neoplasm of breast: Secondary | ICD-10-CM | POA: Diagnosis not present

## 2021-05-25 DIAGNOSIS — R400 Somnolence: Secondary | ICD-10-CM | POA: Diagnosis not present

## 2021-05-25 DIAGNOSIS — R0683 Snoring: Secondary | ICD-10-CM | POA: Diagnosis not present

## 2021-05-25 DIAGNOSIS — G4733 Obstructive sleep apnea (adult) (pediatric): Secondary | ICD-10-CM | POA: Diagnosis not present

## 2021-05-27 DIAGNOSIS — F411 Generalized anxiety disorder: Secondary | ICD-10-CM | POA: Diagnosis not present

## 2021-06-03 ENCOUNTER — Ambulatory Visit: Payer: BC Managed Care – PPO | Admitting: Pulmonary Disease

## 2021-06-03 ENCOUNTER — Other Ambulatory Visit: Payer: Self-pay

## 2021-06-03 ENCOUNTER — Encounter: Payer: Self-pay | Admitting: Pulmonary Disease

## 2021-06-03 VITALS — BP 126/70 | HR 82 | Temp 98.7°F | Ht 65.0 in | Wt 240.6 lb

## 2021-06-03 DIAGNOSIS — Z9989 Dependence on other enabling machines and devices: Secondary | ICD-10-CM | POA: Diagnosis not present

## 2021-06-03 DIAGNOSIS — G4733 Obstructive sleep apnea (adult) (pediatric): Secondary | ICD-10-CM | POA: Diagnosis not present

## 2021-06-03 NOTE — Progress Notes (Signed)
[place] [facility], Critical Care, and Sleep Medicine  Chief Complaint  Patient presents with   Follow-up    Pt states she has been doing okay since last visit. States she is still having some events after starting on the CPAP when she sleeps on her back.    Constitutional:  BP 126/70 (BP Location: Right Arm, Patient Position: Sitting, Cuff Size: Large)   Pulse 82   Temp 98.7 F (37.1 C) (Oral)   Ht 5\' 5"  (1.651 m)   Wt 240 lb 9.6 oz (109.1 kg)   SpO2 98% Comment: RA  BMI 40.04 kg/m   Past Medical History:  Anxiety, Depression, HLD, Migraine headaches, Avascular necrosis of hip  Past Surgical History:  She  has a past surgical history that includes Wisdom tooth extraction; Joint replacement (03-18-11); and Sleeve Gastroplasty (01-13-14).  Brief Summary:  Helen Paul is a 45 y.o. female with obstructive sleep apnea.      Subjective:   She had home sleep study in August.  Showed moderate sleep apnea.  Started on auto CPAP.  Using nasal pillows.  Gets tangled in tubing sometimes.  Husband says she snores when she lays on her back, but not in other positions.  She is sleeping better.  Physical Exam:   Appearance - well kempt   ENMT - no sinus tenderness, no oral exudate, no LAN, Mallampati 3 airway, no stridor, deviated septum  Respiratory - equal breath sounds bilaterally, no wheezing or rales  CV - s1s2 regular rate and rhythm, no murmurs  Ext - no clubbing, no edema  Skin - no rashes  Psych - normal mood and affect   Sleep Tests:  PSG 03/02/11 >> AHI 25, SpO2 84% HST 02/04/21 >> AHI 17.6, SpO2 low 79% Auto CPAP 05/01/21 to 05/30/21 >> used on 27 of 30 nights with average 6 hrs 12 min.  Average AHI 6.8 with median CPAP 9 and 95 th percentile CPAP 15 cm H2O  Social History:  She  reports that she has never smoked. She has never used smokeless tobacco. She reports current alcohol use. She reports that she does not use drugs.  Family History:  Her family  history includes Chronic fatigue in her mother; Diabetes in her father; Fibromyalgia in her mother; Heart attack in her father and mother; Heart disease in her father and mother; Heart failure in her father; Hypertension in her father; Narcolepsy in her father; Obesity in her brother and sister.     Assessment/Plan:   Obstructive sleep apnea. - she is compliant with CPAP and reports benefit from therapy - she uses [employer] for her DME - will change her auto CPAP to 5 to 18 cm H2O - discussed techniques to help prevent her getting tangled in her tubing; she plans to switch to a different mask - reviewed alternative therapies for treating sleep apnea and discussed different between obstructive and central sleep apnea  Time Spent Involved in Patient Care on Day of Examination:  22 minutes  Follow up:   Patient Instructions  Will have [employer] change your auto CPAP to 5 - 18 cm water pressure   Follow up in 1 year  Medication List:   Allergies as of 06/03/2021       Reactions   Hydrocodone Nausea Only   Other Rash   Z-PACK. Z-PACK   Minocycline Other (See Comments)   Other reaction(s): Other Caused pressure in her head Caused pressure in her head   Azithromycin Rash   Hydrocodone-acetaminophen  Nausea Only   Zithromax [azithromycin Dihydrate] Rash        Medication List        Accurate as of June 03, 2021  5:05 PM. If you have any questions, ask your nurse or doctor.          ALPRAZolam 0.5 MG tablet Commonly known as: Xanax Take 1 tablet (0.5 mg total) by mouth 2 (two) times daily as needed for anxiety.   buPROPion 300 MG 24 hr tablet Commonly known as: Wellbutrin XL Take 1 tablet (300 mg total) by mouth daily.   KLS ALLER-TEC PO Take by mouth as needed.   levonorgestrel 20 MCG/24HR IUD Commonly known as: Mirena (52 MG) 1 Intra Uterine Device (1 each total) by Intrauterine route once.   sertraline 25 MG tablet Commonly known as: ZOLOFT Take 1 tablet (25  mg total) by mouth daily.   vitamin B-12 100 MCG tablet Commonly known as: CYANOCOBALAMIN Vitamin B12        Signature:  Coralyn Helling, MD [place] [facility]monary/Critical Care Pager - [phone] 06/03/2021, 5:05 PM

## 2021-06-03 NOTE — Patient Instructions (Signed)
Will have [employer] change your auto CPAP to 5 - 18 cm water pressure   Follow up in 1 year

## 2021-06-04 DIAGNOSIS — G4733 Obstructive sleep apnea (adult) (pediatric): Secondary | ICD-10-CM | POA: Diagnosis not present

## 2021-06-17 DIAGNOSIS — F411 Generalized anxiety disorder: Secondary | ICD-10-CM | POA: Diagnosis not present

## 2021-06-24 DIAGNOSIS — R0683 Snoring: Secondary | ICD-10-CM | POA: Diagnosis not present

## 2021-06-24 DIAGNOSIS — R400 Somnolence: Secondary | ICD-10-CM | POA: Diagnosis not present

## 2021-06-24 DIAGNOSIS — G4733 Obstructive sleep apnea (adult) (pediatric): Secondary | ICD-10-CM | POA: Diagnosis not present

## 2021-06-28 DIAGNOSIS — R41840 Attention and concentration deficit: Secondary | ICD-10-CM | POA: Diagnosis not present

## 2021-06-28 DIAGNOSIS — F902 Attention-deficit hyperactivity disorder, combined type: Secondary | ICD-10-CM | POA: Diagnosis not present

## 2021-06-28 DIAGNOSIS — F419 Anxiety disorder, unspecified: Secondary | ICD-10-CM | POA: Diagnosis not present

## 2021-07-15 DIAGNOSIS — F411 Generalized anxiety disorder: Secondary | ICD-10-CM | POA: Diagnosis not present

## 2021-07-23 DIAGNOSIS — G4733 Obstructive sleep apnea (adult) (pediatric): Secondary | ICD-10-CM | POA: Diagnosis not present

## 2021-07-29 DIAGNOSIS — F411 Generalized anxiety disorder: Secondary | ICD-10-CM | POA: Diagnosis not present

## 2021-08-12 DIAGNOSIS — R41840 Attention and concentration deficit: Secondary | ICD-10-CM | POA: Diagnosis not present

## 2021-08-12 DIAGNOSIS — Z79899 Other long term (current) drug therapy: Secondary | ICD-10-CM | POA: Diagnosis not present

## 2021-08-12 DIAGNOSIS — F902 Attention-deficit hyperactivity disorder, combined type: Secondary | ICD-10-CM | POA: Diagnosis not present

## 2021-08-13 DIAGNOSIS — F411 Generalized anxiety disorder: Secondary | ICD-10-CM | POA: Diagnosis not present

## 2021-08-26 DIAGNOSIS — F411 Generalized anxiety disorder: Secondary | ICD-10-CM | POA: Diagnosis not present

## 2021-09-03 DIAGNOSIS — G4733 Obstructive sleep apnea (adult) (pediatric): Secondary | ICD-10-CM | POA: Diagnosis not present

## 2021-09-09 DIAGNOSIS — F411 Generalized anxiety disorder: Secondary | ICD-10-CM | POA: Diagnosis not present

## 2021-09-16 DIAGNOSIS — F411 Generalized anxiety disorder: Secondary | ICD-10-CM | POA: Diagnosis not present

## 2021-09-23 DIAGNOSIS — F411 Generalized anxiety disorder: Secondary | ICD-10-CM | POA: Diagnosis not present

## 2021-10-08 DIAGNOSIS — F411 Generalized anxiety disorder: Secondary | ICD-10-CM | POA: Diagnosis not present

## 2021-10-14 DIAGNOSIS — F411 Generalized anxiety disorder: Secondary | ICD-10-CM | POA: Diagnosis not present

## 2021-10-21 DIAGNOSIS — F411 Generalized anxiety disorder: Secondary | ICD-10-CM | POA: Diagnosis not present

## 2021-11-04 DIAGNOSIS — F411 Generalized anxiety disorder: Secondary | ICD-10-CM | POA: Diagnosis not present

## 2021-11-07 DIAGNOSIS — F902 Attention-deficit hyperactivity disorder, combined type: Secondary | ICD-10-CM | POA: Diagnosis not present

## 2021-11-07 DIAGNOSIS — F411 Generalized anxiety disorder: Secondary | ICD-10-CM | POA: Diagnosis not present

## 2021-11-07 DIAGNOSIS — Z79899 Other long term (current) drug therapy: Secondary | ICD-10-CM | POA: Diagnosis not present

## 2021-11-12 DIAGNOSIS — F411 Generalized anxiety disorder: Secondary | ICD-10-CM | POA: Diagnosis not present

## 2021-11-18 ENCOUNTER — Other Ambulatory Visit: Payer: Self-pay

## 2021-11-18 ENCOUNTER — Encounter: Payer: Self-pay | Admitting: Family Medicine

## 2021-11-18 MED ORDER — BUPROPION HCL ER (XL) 300 MG PO TB24: 300.0000 mg | ORAL_TABLET | Freq: Every day | ORAL | 0 refills | Status: DC

## 2021-11-19 ENCOUNTER — Other Ambulatory Visit: Payer: Self-pay | Admitting: Family Medicine

## 2021-11-19 IMAGING — US US BREAST*L* LIMITED INC AXILLA
1 series · 7 of 7 positions shown · non-contrast
Comparison: Previous exam(s).

CLINICAL DATA: Recall from screening mammography with
tomosynthesis, possible mass or developing asymmetry involving the
central LEFT breast.

EXAM:
DIGITAL DIAGNOSTIC LEFT MAMMOGRAM
ULTRASOUND LEFT BREAST

[Series 1: us breast*left* limited inc axilla · 0.07mm/px · 7 of 7 slices shown]
[im 1/7]
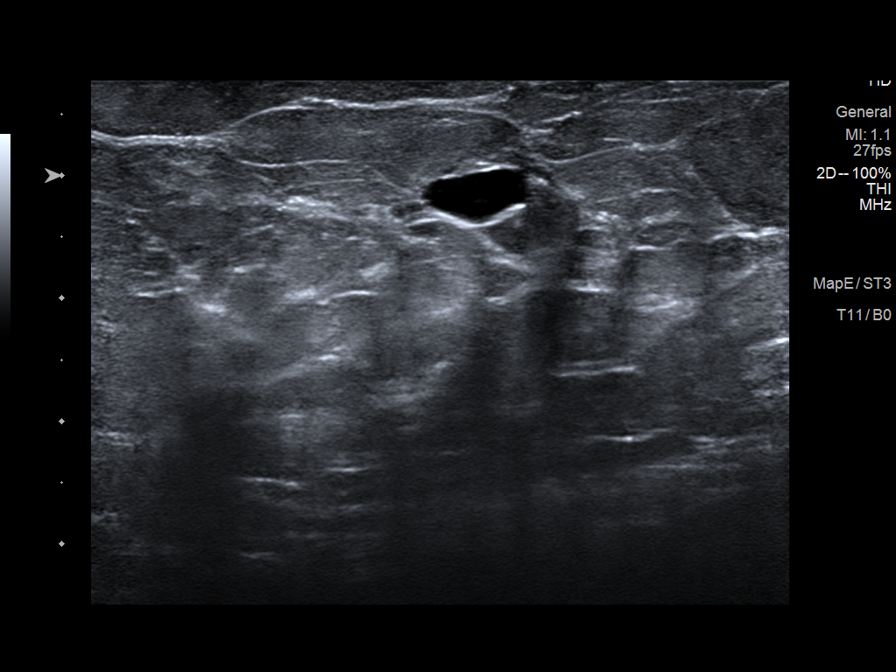
[im 2/7]
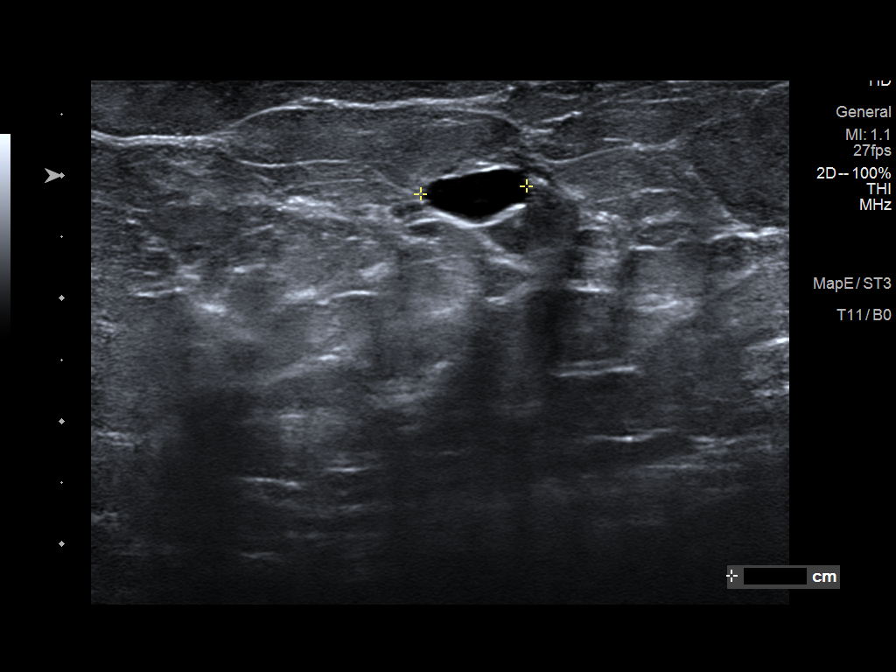
[im 3/7]
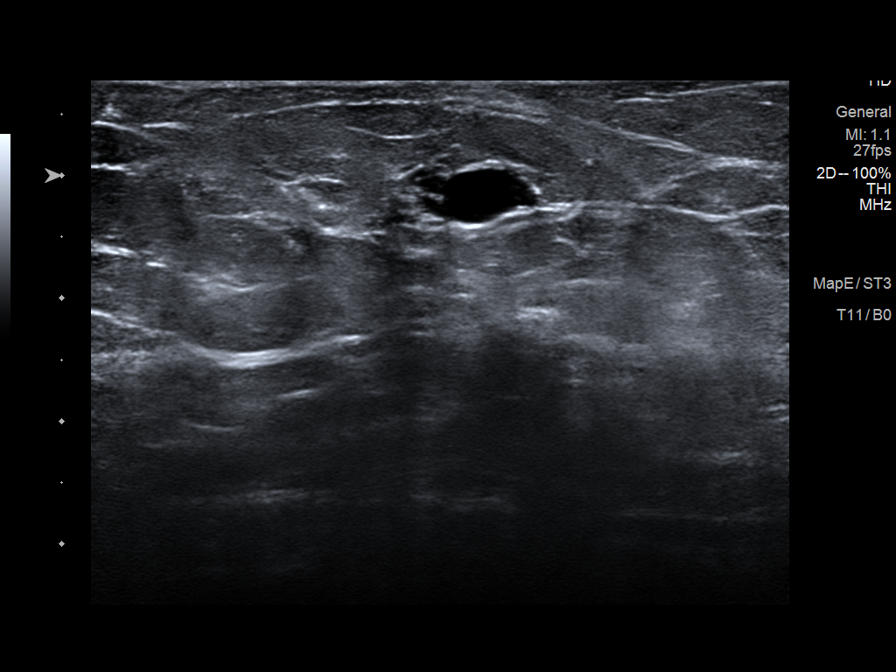
[im 4/7]
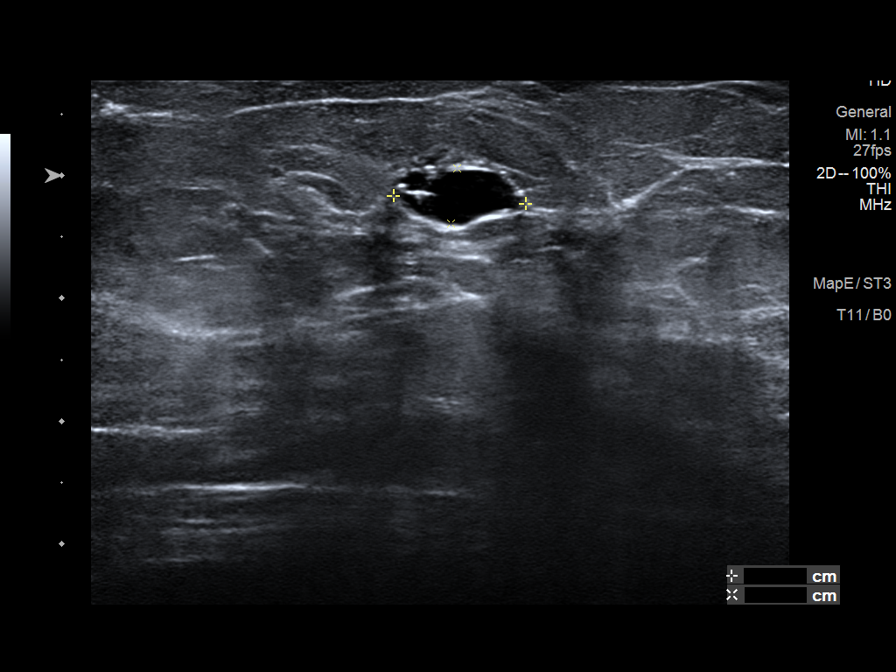
[im 5/7]
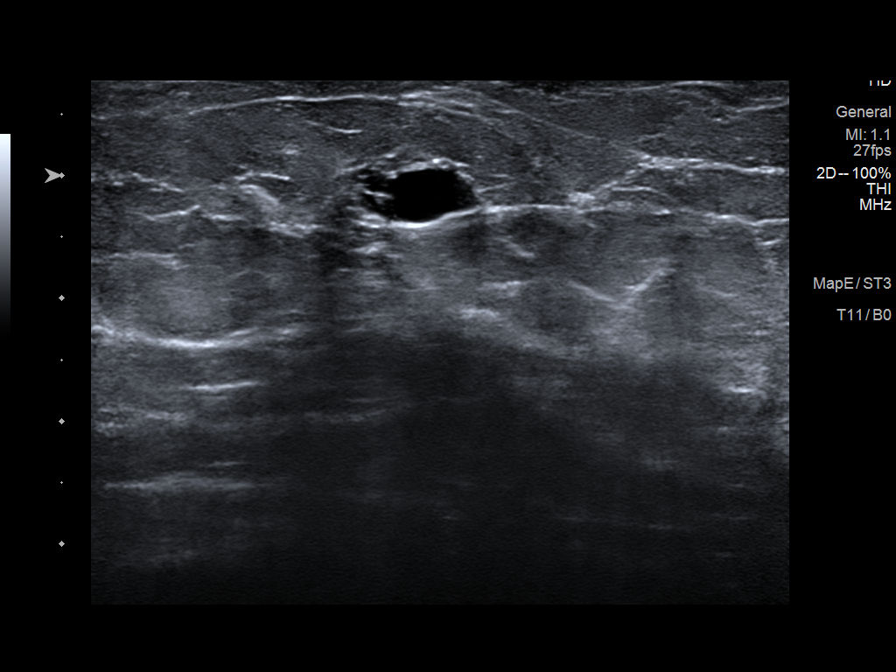
[im 6/7]
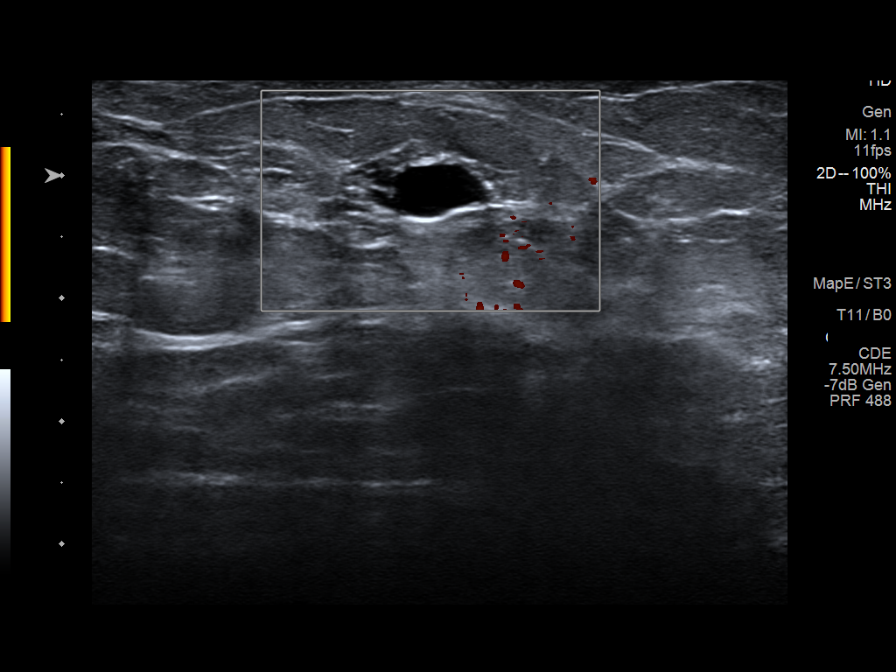
[im 7/7]
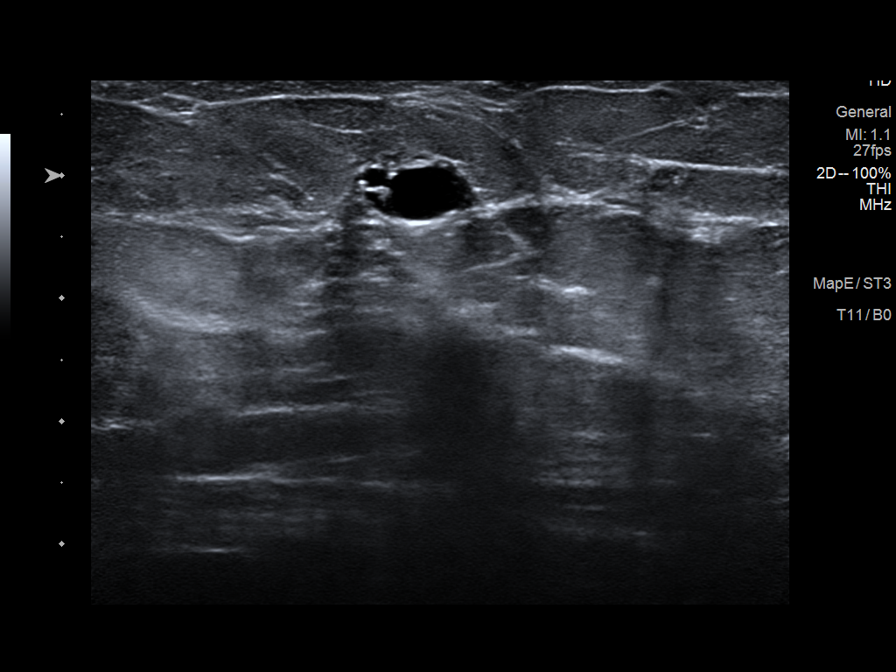

[7 of 7 positions shown; findings below may reference images not displayed]

ACR Breast Density Category b: There are scattered areas of
fibroglandular density.
FINDINGS: Tomosynthesis and synthesized spot-compression CC and MLO views of
the area of concern in the LEFT breast were obtained.

Spot compression images confirm a persistent asymmetry or possible
partially obscured low-density mass measuring approximately 1 cm in
the slight LOWER breast at MIDDLE depth, associated with 2 benign
appearing punctate calcifications. There is no associated
architectural distortion or suspicious calcifications.

Targeted LEFT breast ultrasound is performed, showing an oval
circumscribed parallel anechoic mass with thin internal septations
at the 5 o'clock position approximately 7 cm from the nipple at
ANTERIOR to MIDDLE depth measuring approximately 0.5 x 1.1 x 0.9 cm,
with the punctate calcifications in the cyst wall, demonstrating
posterior acoustic enhancement and no internal power Doppler flow,
accounting for the screening mammographic finding. No suspicious
solid mass or abnormal acoustic shadowing is identified.
IMPRESSION: 1. No mammographic or sonographic evidence of malignancy involving
the LEFT breast.
2. Benign approximate 1 cm cyst in the LOWER OUTER LEFT breast which
accounts for the screening mammographic finding.

RECOMMENDATION:
Screening mammogram in one year.(Code:[id number]-[id number])

I have discussed the findings and recommendations with the patient.
If applicable, a reminder letter will be sent to the patient
regarding the next appointment.

BI-RADS CATEGORY  2: Benign.

## 2021-11-26 DIAGNOSIS — F411 Generalized anxiety disorder: Secondary | ICD-10-CM | POA: Diagnosis not present

## 2021-11-27 ENCOUNTER — Encounter: Payer: Self-pay | Admitting: Family Medicine

## 2021-11-27 ENCOUNTER — Ambulatory Visit (INDEPENDENT_AMBULATORY_CARE_PROVIDER_SITE_OTHER): Payer: BC Managed Care – PPO | Admitting: Family Medicine

## 2021-11-27 VITALS — BP 118/80 | HR 73 | Temp 98.7°F | Ht 65.0 in | Wt 244.0 lb

## 2021-11-27 DIAGNOSIS — Z0000 Encounter for general adult medical examination without abnormal findings: Secondary | ICD-10-CM | POA: Diagnosis not present

## 2021-11-27 DIAGNOSIS — F900 Attention-deficit hyperactivity disorder, predominantly inattentive type: Secondary | ICD-10-CM | POA: Diagnosis not present

## 2021-11-27 LAB — BASIC METABOLIC PANEL
BUN: 16 mg/dL (ref 6–23)
CO2: 27 mEq/L (ref 19–32)
Calcium: 9.5 mg/dL (ref 8.4–10.5)
Chloride: 102 mEq/L (ref 96–112)
Creatinine, Ser: 0.83 mg/dL (ref 0.40–1.20)
GFR: 84.69 mL/min (ref >60.00)
Glucose, Bld: 92 mg/dL (ref 70–99)
Potassium: 4.1 mEq/L (ref 3.5–5.1)
Sodium: 137 mEq/L (ref 135–145)

## 2021-11-27 LAB — CBC WITH DIFFERENTIAL/PLATELET
Basophils Absolute: 0 10*3/uL (ref 0.0–0.1)
Basophils Relative: 0.7 % (ref 0.0–3.0)
Eosinophils Absolute: 0.2 10*3/uL (ref 0.0–0.7)
Eosinophils Relative: 2.5 % (ref 0.0–5.0)
HCT: 39.5 % (ref 36.0–46.0)
Hemoglobin: 13.1 g/dL (ref 12.0–15.0)
Lymphocytes Relative: 31.3 % (ref 12.0–46.0)
Lymphs Abs: 1.9 10*3/uL (ref 0.7–4.0)
MCHC: 33.3 g/dL (ref 30.0–36.0)
MCV: 88 fl (ref 78.0–100.0)
Monocytes Absolute: 0.4 10*3/uL (ref 0.1–1.0)
Monocytes Relative: 6.5 % (ref 3.0–12.0)
Neutro Abs: 3.5 10*3/uL (ref 1.4–7.7)
Neutrophils Relative %: 59 % (ref 43.0–77.0)
Platelets: 331 10*3/uL (ref 150.0–400.0)
RBC: 4.48 Mil/uL (ref 3.87–5.11)
RDW: 14.8 % (ref 11.5–15.5)
WBC: 6 10*3/uL (ref 4.0–10.5)

## 2021-11-27 LAB — LIPID PANEL
Cholesterol: 212 mg/dL — ABNORMAL HIGH (ref 0–200)
HDL: 59.8 mg/dL (ref >39.00)
LDL Cholesterol: 133 mg/dL — ABNORMAL HIGH (ref 0–99)
NonHDL: 151.71
Total CHOL/HDL Ratio: 4
Triglycerides: 95 mg/dL (ref 0.0–149.0)
VLDL: 19 mg/dL (ref 0.0–40.0)

## 2021-11-27 LAB — HEPATIC FUNCTION PANEL
ALT: 22 U/L (ref 0–35)
AST: 24 U/L (ref 0–37)
Albumin: 4.4 g/dL (ref 3.5–5.2)
Alkaline Phosphatase: 71 U/L (ref 39–117)
Bilirubin, Direct: 0.1 mg/dL (ref 0.0–0.3)
Total Bilirubin: 0.6 mg/dL (ref 0.2–1.2)
Total Protein: 7.4 g/dL (ref 6.0–8.3)

## 2021-11-27 LAB — HEMOGLOBIN A1C: Hgb A1c MFr Bld: 5.8 % (ref 4.6–6.5)

## 2021-11-27 LAB — TSH: TSH: 1.99 u[IU]/mL (ref 0.35–5.50)

## 2021-11-27 MED ORDER — ALPRAZOLAM 0.5 MG PO TABS: 1.0000 mg | ORAL_TABLET | Freq: Two times a day (BID) | ORAL | 5 refills | Status: DC | PRN

## 2021-11-27 NOTE — Progress Notes (Signed)
Subjective:    Patient ID: Helen Paul, female    DOB: Oct 25, 1975, 46 y.o.   MRN: [id number]  HPI Here for a well exam. She feels well physically but she wants to discuss her emotional state. She has been taking Zoloft and Wellbutrin for years, but over the past year she has been dealing with a lot of anxiety and with feeling overwhelmed and out of control. She has some depression symptoms as well, but the anxiety has been the main problem. She has not been sleeping well. She saw Dr. Marisue Brooklyn about 3 months ago, and she underwent testing that revealed her to have significant ADHD. She was started on Vyvanse, and they have been titrating her dose up to the current dose of 50 mg a day. Amani finds this incredibly helpful to organize her thoughts and to allow her to get things done in her life. She feels less anxious because she now has a feeling of having more control over her life. She finds that taking a double dose of Xanax at bedtime helps her sleeping quite a bit. Finally she has gained back all the weight she lost after having the sleeve gastrectomy in 2015, and she is considering having a revision sleeve gastrectomy.    Review of Systems  Constitutional: Negative.   HENT: Negative.    Eyes: Negative.   Respiratory: Negative.    Cardiovascular: Negative.   Gastrointestinal: Negative.   Genitourinary:  Negative for decreased urine volume, difficulty urinating, dyspareunia, dysuria, enuresis, flank pain, frequency, hematuria, pelvic pain and urgency.  Musculoskeletal: Negative.   Skin: Negative.   Neurological: Negative.  Negative for headaches.  Psychiatric/Behavioral:  Positive for decreased concentration, dysphoric mood and sleep disturbance. Negative for agitation, behavioral problems, confusion and hallucinations. The patient is nervous/anxious.       Objective:   Physical Exam Constitutional:      General: She is not in acute distress.    Appearance: She is  well-developed. She is obese.  HENT:     Head: Normocephalic and atraumatic.     Right Ear: External ear normal.     Left Ear: External ear normal.     Nose: Nose normal.     Mouth/Throat:     Pharynx: No oropharyngeal exudate.  Eyes:     General: No scleral icterus.    Conjunctiva/sclera: Conjunctivae normal.     Pupils: Pupils are equal, round, and reactive to light.  Neck:     Thyroid: No thyromegaly.     Vascular: No JVD.  Cardiovascular:     Rate and Rhythm: Normal rate and regular rhythm.     Heart sounds: Normal heart sounds. No murmur heard.   No friction rub. No gallop.  Pulmonary:     Effort: Pulmonary effort is normal. No respiratory distress.     Breath sounds: Normal breath sounds. No wheezing or rales.  Chest:     Chest wall: No tenderness.  Abdominal:     General: Bowel sounds are normal. There is no distension.     Palpations: Abdomen is soft. There is no mass.     Tenderness: There is no abdominal tenderness. There is no guarding or rebound.  Musculoskeletal:        General: No tenderness. Normal range of motion.     Cervical back: Normal range of motion and neck supple.  Lymphadenopathy:     Cervical: No cervical adenopathy.  Skin:    General: Skin is warm and dry.  Findings: No erythema or rash.  Neurological:     Mental Status: She is alert and oriented to person, place, and time.     Cranial Nerves: No cranial nerve deficit.     Motor: No abnormal muscle tone.     Coordination: Coordination normal.     Deep Tendon Reflexes: Reflexes are normal and symmetric. Reflexes normal.  Psychiatric:        Behavior: Behavior normal.        Thought Content: Thought content normal.        Judgment: Judgment normal.     Comments: She is anxious           Assessment & Plan:  Well exam. We discussed diet and exercise. Get fasting labs. She will follow up Dr. Elisabeth Most for the ADHD. For the anxiety, we agreed to stop the Zoloft. I think getting the ADHD  under control will help the anxiety quite a bit. For sleep she will take 2 Xanax tablets (a total of 1 mg) at bedtime. She is concerned about a family hx of heart disease so we will set her up for a cardiac CT calcium test soon. For the obesity, she will talk to Dr. Lily Peer about the possibility of having a sleeve gastrectomy revision.  Gershon Crane, MD

## 2021-12-09 DIAGNOSIS — F411 Generalized anxiety disorder: Secondary | ICD-10-CM | POA: Diagnosis not present

## 2021-12-16 ENCOUNTER — Other Ambulatory Visit: Payer: Self-pay | Admitting: Family Medicine

## 2021-12-17 DIAGNOSIS — F411 Generalized anxiety disorder: Secondary | ICD-10-CM | POA: Diagnosis not present

## 2021-12-20 ENCOUNTER — Encounter: Payer: Self-pay | Admitting: Family Medicine

## 2021-12-20 MED ORDER — ALPRAZOLAM 0.5 MG PO TABS: 1.0000 mg | ORAL_TABLET | Freq: Two times a day (BID) | ORAL | 5 refills | Status: DC | PRN

## 2021-12-20 NOTE — Telephone Encounter (Signed)
Done

## 2021-12-23 DIAGNOSIS — F411 Generalized anxiety disorder: Secondary | ICD-10-CM | POA: Diagnosis not present

## 2021-12-27 ENCOUNTER — Ambulatory Visit: Payer: BC Managed Care – PPO | Admitting: Family Medicine

## 2021-12-27 ENCOUNTER — Encounter: Payer: Self-pay | Admitting: Family Medicine

## 2021-12-27 VITALS — BP 114/70 | HR 78 | Temp 98.5°F | Wt 245.0 lb

## 2021-12-27 DIAGNOSIS — N62 Hypertrophy of breast: Secondary | ICD-10-CM | POA: Diagnosis not present

## 2021-12-27 DIAGNOSIS — F418 Other specified anxiety disorders: Secondary | ICD-10-CM | POA: Diagnosis not present

## 2021-12-27 DIAGNOSIS — M542 Cervicalgia: Secondary | ICD-10-CM | POA: Diagnosis not present

## 2021-12-27 DIAGNOSIS — G8929 Other chronic pain: Secondary | ICD-10-CM | POA: Diagnosis not present

## 2021-12-27 DIAGNOSIS — G4733 Obstructive sleep apnea (adult) (pediatric): Secondary | ICD-10-CM | POA: Diagnosis not present

## 2021-12-27 MED ORDER — BUPROPION HCL ER (XL) 300 MG PO TB24: 300.0000 mg | ORAL_TABLET | Freq: Every day | ORAL | 3 refills | Status: DC

## 2021-12-30 ENCOUNTER — Ambulatory Visit: Payer: BC Managed Care – PPO | Admitting: Family Medicine

## 2022-01-06 DIAGNOSIS — F411 Generalized anxiety disorder: Secondary | ICD-10-CM | POA: Diagnosis not present

## 2022-01-13 DIAGNOSIS — F411 Generalized anxiety disorder: Secondary | ICD-10-CM | POA: Diagnosis not present

## 2022-01-21 DIAGNOSIS — F411 Generalized anxiety disorder: Secondary | ICD-10-CM | POA: Diagnosis not present

## 2022-02-07 DIAGNOSIS — Z79899 Other long term (current) drug therapy: Secondary | ICD-10-CM | POA: Diagnosis not present

## 2022-02-07 DIAGNOSIS — F902 Attention-deficit hyperactivity disorder, combined type: Secondary | ICD-10-CM | POA: Diagnosis not present

## 2022-02-11 DIAGNOSIS — F902 Attention-deficit hyperactivity disorder, combined type: Secondary | ICD-10-CM | POA: Diagnosis not present

## 2022-02-18 DIAGNOSIS — F411 Generalized anxiety disorder: Secondary | ICD-10-CM | POA: Diagnosis not present

## 2022-03-03 DIAGNOSIS — F411 Generalized anxiety disorder: Secondary | ICD-10-CM | POA: Diagnosis not present

## 2022-03-18 ENCOUNTER — Institutional Professional Consult (permissible substitution): Payer: BC Managed Care – PPO | Admitting: Plastic Surgery

## 2022-03-26 ENCOUNTER — Ambulatory Visit: Payer: BC Managed Care – PPO | Admitting: Family Medicine

## 2022-04-21 DIAGNOSIS — Z79899 Other long term (current) drug therapy: Secondary | ICD-10-CM | POA: Diagnosis not present

## 2022-04-21 DIAGNOSIS — F902 Attention-deficit hyperactivity disorder, combined type: Secondary | ICD-10-CM | POA: Diagnosis not present

## 2022-04-23 DIAGNOSIS — F411 Generalized anxiety disorder: Secondary | ICD-10-CM | POA: Diagnosis not present

## 2022-04-28 ENCOUNTER — Telehealth (INDEPENDENT_AMBULATORY_CARE_PROVIDER_SITE_OTHER): Payer: BC Managed Care – PPO | Admitting: Family Medicine

## 2022-04-28 ENCOUNTER — Encounter: Payer: Self-pay | Admitting: Family Medicine

## 2022-04-28 DIAGNOSIS — F900 Attention-deficit hyperactivity disorder, predominantly inattentive type: Secondary | ICD-10-CM | POA: Diagnosis not present

## 2022-04-28 DIAGNOSIS — F418 Other specified anxiety disorders: Secondary | ICD-10-CM | POA: Diagnosis not present

## 2022-04-28 MED ORDER — WEGOVY 0.25 MG/0.5ML ~~LOC~~ SOAJ: 0.2500 mg | SUBCUTANEOUS | 5 refills | Status: DC

## 2022-04-28 NOTE — Progress Notes (Signed)
Subjective:    Patient ID: Helen Paul, female    DOB: [birth year], 46 y.o.   MRN: [id number]  HPI Virtual Visit via Video Note  I connected with the patient on 04/28/22 at  3:45 PM EDT by a video enabled telemedicine application and verified that I am speaking with the correct person using two identifiers.  Location patient: home Location provider:work or home office Persons participating in the virtual visit: patient, provider  I discussed the limitations of evaluation and management by telemedicine and the availability of in person appointments. The patient expressed understanding and agreed to proceed.   HPI: Here to discuss several issues. First she wants to bring me up to date on her ADHD. She sees Dr. Rachel Moulds for this, and she recently added Intuniv ER to her medications. She takes Vyvanse in the AM and Intuniv in the PM. For the depression with anxiety, she has been taking Wellbutrin XL 300 mg daily and she uses Xanax as needed. This has been working very well for her with no side effects. She also wants to talk about her obesity. She had bariatric surgery in the past with little success. She has been talking to a coworker who has been taking Wegovy shots. These have been helping her and after a prior authorization she pays only $25 a month for it.    ROS: See pertinent positives and negatives per HPI.  Past Medical History:  Diagnosis Date   Anxiety    Avascular necrosis of hip ([place])    Depression    Hip pain, bilateral    Hyperlipidemia    Migraines    Sleep apnea     Past Surgical History:  Procedure Laterality Date   JOINT REPLACEMENT  03-18-11   left total hip, per Dr. Florian Buff at [place]  01-13-14   per Dr. Toney Rakes at [place] History  Problem Relation Age of Onset   Fibromyalgia Mother    Chronic fatigue Mother    Heart disease Mother    Heart attack Mother    Heart disease Father     Hypertension Father    Diabetes Father    Heart failure Father    Narcolepsy Father    Heart attack Father    Obesity Sister    Obesity Brother      Current Outpatient Medications:    ALPRAZolam (XANAX) 0.5 MG tablet, Take 2 tablets (1 mg total) by mouth 2 (two) times daily as needed for anxiety or sleep., Disp: 60 tablet, Rfl: 5   buPROPion (WELLBUTRIN XL) 300 MG 24 hr tablet, Take 1 tablet (300 mg total) by mouth daily., Disp: 90 tablet, Rfl: 3   Cetirizine HCl (KLS ALLER-TEC PO), Take by mouth as needed., Disp: , Rfl:    guanFACINE (INTUNIV) 1 MG TB24 ER tablet, Take by mouth., Disp: , Rfl:    levocetirizine (XYZAL) 5 MG tablet, Take 5 mg by mouth as needed for allergies., Disp: , Rfl:    vitamin B-12 (CYANOCOBALAMIN) 100 MCG tablet, Vitamin B12, Disp: , Rfl:    VYVANSE 50 MG capsule, Take 50 mg by mouth daily., Disp: , Rfl:    levonorgestrel (MIRENA, 52 MG,) 20 MCG/24HR IUD, 1 Intra Uterine Device (1 each total) by Intrauterine route once., Disp: 1 each, Rfl: 0  EXAM:  VITALS per patient if applicable:  GENERAL: alert, oriented, appears well and in no acute distress  HEENT: atraumatic, conjunttiva clear, no obvious abnormalities on inspection of external nose and ears  NECK: normal movements of the head and neck  LUNGS: on inspection no signs of respiratory distress, breathing rate appears normal, no obvious gross SOB, gasping or wheezing  CV: no obvious cyanosis  MS: moves all visible extremities without noticeable abnormality  PSYCH/NEURO: pleasant and cooperative, no obvious depression or anxiety, speech and thought processing grossly intact  ASSESSMENT AND PLAN: She will follow up with Dr. Elisabeth Most for the ADHD. Her depression and anxiety are well controlled, so she will stay on the Wellbutrin XL and Xanax. For the obesity, she will try Wegovy 0.25 mg weekly.  Gershon Crane, MD  Discussed the following assessment and plan:  ADHD (attention deficit hyperactivity  disorder), inattentive type  Depression with anxiety  Morbid obesity (HCC)     I discussed the assessment and treatment plan with the patient. The patient was provided an opportunity to ask questions and all were answered. The patient agreed with the plan and demonstrated an understanding of the instructions.   The patient was advised to call back or seek an in-person evaluation if the symptoms worsen or if the condition fails to improve as anticipated.      Review of Systems     Objective:   Physical Exam        Assessment & Plan:

## 2022-06-03 DIAGNOSIS — Z1231 Encounter for screening mammogram for malignant neoplasm of breast: Secondary | ICD-10-CM | POA: Diagnosis not present

## 2022-06-10 ENCOUNTER — Institutional Professional Consult (permissible substitution): Payer: BC Managed Care – PPO | Admitting: Plastic Surgery

## 2022-06-11 DIAGNOSIS — Z01419 Encounter for gynecological examination (general) (routine) without abnormal findings: Secondary | ICD-10-CM | POA: Diagnosis not present

## 2022-06-11 DIAGNOSIS — Z6839 Body mass index (BMI) 39.0-39.9, adult: Secondary | ICD-10-CM | POA: Diagnosis not present

## 2022-06-12 DIAGNOSIS — F411 Generalized anxiety disorder: Secondary | ICD-10-CM | POA: Diagnosis not present

## 2022-06-20 DIAGNOSIS — G4733 Obstructive sleep apnea (adult) (pediatric): Secondary | ICD-10-CM | POA: Diagnosis not present

## 2022-06-24 DIAGNOSIS — F411 Generalized anxiety disorder: Secondary | ICD-10-CM | POA: Diagnosis not present

## 2022-07-11 ENCOUNTER — Other Ambulatory Visit: Payer: Self-pay | Admitting: Family Medicine

## 2022-07-11 ENCOUNTER — Encounter: Payer: Self-pay | Admitting: Family Medicine

## 2022-07-11 DIAGNOSIS — Z79899 Other long term (current) drug therapy: Secondary | ICD-10-CM | POA: Diagnosis not present

## 2022-07-11 DIAGNOSIS — F902 Attention-deficit hyperactivity disorder, combined type: Secondary | ICD-10-CM | POA: Diagnosis not present

## 2022-07-14 NOTE — Telephone Encounter (Signed)
Please look into this PA and see where we are

## 2022-07-15 NOTE — Telephone Encounter (Signed)
Message sent to Prior auth team

## 2022-07-16 ENCOUNTER — Other Ambulatory Visit (HOSPITAL_COMMUNITY): Payer: Self-pay

## 2022-07-16 DIAGNOSIS — F411 Generalized anxiety disorder: Secondary | ICD-10-CM | POA: Diagnosis not present

## 2022-07-16 NOTE — Telephone Encounter (Signed)
Patient Advocate Encounter   Received notification from [facility] that prior authorization for [facility] 0.25MG /0.5ML is required.   PA submitted on 07/16/2022 Key [id number] Status is pending

## 2022-07-16 NOTE — Telephone Encounter (Signed)
Noted  

## 2022-07-22 ENCOUNTER — Other Ambulatory Visit (HOSPITAL_COMMUNITY): Payer: Self-pay

## 2022-07-30 DIAGNOSIS — F411 Generalized anxiety disorder: Secondary | ICD-10-CM | POA: Diagnosis not present

## 2022-08-05 DIAGNOSIS — F411 Generalized anxiety disorder: Secondary | ICD-10-CM | POA: Diagnosis not present

## 2022-08-13 DIAGNOSIS — F411 Generalized anxiety disorder: Secondary | ICD-10-CM | POA: Diagnosis not present

## 2022-08-19 DIAGNOSIS — G4733 Obstructive sleep apnea (adult) (pediatric): Secondary | ICD-10-CM | POA: Diagnosis not present

## 2022-08-20 DIAGNOSIS — F411 Generalized anxiety disorder: Secondary | ICD-10-CM | POA: Diagnosis not present

## 2022-08-27 DIAGNOSIS — F411 Generalized anxiety disorder: Secondary | ICD-10-CM | POA: Diagnosis not present

## 2022-09-02 DIAGNOSIS — F411 Generalized anxiety disorder: Secondary | ICD-10-CM | POA: Diagnosis not present

## 2022-09-10 ENCOUNTER — Encounter: Payer: Self-pay | Admitting: Family Medicine

## 2022-09-11 DIAGNOSIS — F411 Generalized anxiety disorder: Secondary | ICD-10-CM | POA: Diagnosis not present

## 2022-09-11 MED ORDER — ALPRAZOLAM 0.5 MG PO TABS: 0.5000 mg | ORAL_TABLET | Freq: Two times a day (BID) | ORAL | 5 refills | Status: DC | PRN

## 2022-09-11 NOTE — Telephone Encounter (Signed)
Done

## 2022-09-30 DIAGNOSIS — F411 Generalized anxiety disorder: Secondary | ICD-10-CM | POA: Diagnosis not present

## 2022-10-07 DIAGNOSIS — F411 Generalized anxiety disorder: Secondary | ICD-10-CM | POA: Diagnosis not present

## 2022-10-10 DIAGNOSIS — F902 Attention-deficit hyperactivity disorder, combined type: Secondary | ICD-10-CM | POA: Diagnosis not present

## 2022-10-10 DIAGNOSIS — F419 Anxiety disorder, unspecified: Secondary | ICD-10-CM | POA: Diagnosis not present

## 2022-10-10 DIAGNOSIS — Z79899 Other long term (current) drug therapy: Secondary | ICD-10-CM | POA: Diagnosis not present

## 2022-10-14 DIAGNOSIS — F411 Generalized anxiety disorder: Secondary | ICD-10-CM | POA: Diagnosis not present

## 2022-10-21 DIAGNOSIS — F411 Generalized anxiety disorder: Secondary | ICD-10-CM | POA: Diagnosis not present

## 2022-10-29 DIAGNOSIS — F411 Generalized anxiety disorder: Secondary | ICD-10-CM | POA: Diagnosis not present

## 2022-11-05 DIAGNOSIS — F411 Generalized anxiety disorder: Secondary | ICD-10-CM | POA: Diagnosis not present

## 2022-11-25 DIAGNOSIS — F411 Generalized anxiety disorder: Secondary | ICD-10-CM | POA: Diagnosis not present

## 2022-12-02 DIAGNOSIS — F411 Generalized anxiety disorder: Secondary | ICD-10-CM | POA: Diagnosis not present

## 2022-12-11 DIAGNOSIS — F411 Generalized anxiety disorder: Secondary | ICD-10-CM | POA: Diagnosis not present

## 2022-12-25 DIAGNOSIS — G4733 Obstructive sleep apnea (adult) (pediatric): Secondary | ICD-10-CM | POA: Diagnosis not present

## 2022-12-25 DIAGNOSIS — F411 Generalized anxiety disorder: Secondary | ICD-10-CM | POA: Diagnosis not present

## 2022-12-30 DIAGNOSIS — F411 Generalized anxiety disorder: Secondary | ICD-10-CM | POA: Diagnosis not present

## 2023-01-07 DIAGNOSIS — F902 Attention-deficit hyperactivity disorder, combined type: Secondary | ICD-10-CM | POA: Diagnosis not present

## 2023-01-07 DIAGNOSIS — Z79899 Other long term (current) drug therapy: Secondary | ICD-10-CM | POA: Diagnosis not present

## 2023-01-12 ENCOUNTER — Encounter: Payer: Self-pay | Admitting: Family Medicine

## 2023-01-12 MED ORDER — BUPROPION HCL ER (XL) 300 MG PO TB24: 300.0000 mg | ORAL_TABLET | Freq: Every day | ORAL | 0 refills | Status: DC

## 2023-01-16 ENCOUNTER — Other Ambulatory Visit: Payer: Self-pay | Admitting: Family Medicine

## 2023-01-19 ENCOUNTER — Encounter: Payer: Self-pay | Admitting: Family Medicine

## 2023-01-19 ENCOUNTER — Ambulatory Visit (INDEPENDENT_AMBULATORY_CARE_PROVIDER_SITE_OTHER): Payer: BC Managed Care – PPO | Admitting: Family Medicine

## 2023-01-19 VITALS — BP 110/70 | HR 72 | Temp 98.2°F | Ht 65.0 in | Wt 228.0 lb

## 2023-01-19 DIAGNOSIS — Z0000 Encounter for general adult medical examination without abnormal findings: Secondary | ICD-10-CM | POA: Diagnosis not present

## 2023-01-19 LAB — HEPATIC FUNCTION PANEL
ALT: 16 U/L (ref 0–35)
AST: 15 U/L (ref 0–37)
Albumin: 4.2 g/dL (ref 3.5–5.2)
Alkaline Phosphatase: 70 U/L (ref 39–117)
Bilirubin, Direct: 0.1 mg/dL (ref 0.0–0.3)
Total Bilirubin: 0.4 mg/dL (ref 0.2–1.2)
Total Protein: 7 g/dL (ref 6.0–8.3)

## 2023-01-19 LAB — CBC WITH DIFFERENTIAL/PLATELET
Basophils Absolute: 0 10*3/uL (ref 0.0–0.1)
Basophils Relative: 0.4 % (ref 0.0–3.0)
Eosinophils Absolute: 0.2 10*3/uL (ref 0.0–0.7)
Eosinophils Relative: 2.9 % (ref 0.0–5.0)
HCT: 40.4 % (ref 36.0–46.0)
Hemoglobin: 13.2 g/dL (ref 12.0–15.0)
Lymphocytes Relative: 30.9 % (ref 12.0–46.0)
Lymphs Abs: 1.9 10*3/uL (ref 0.7–4.0)
MCHC: 32.5 g/dL (ref 30.0–36.0)
MCV: 89.9 fl (ref 78.0–100.0)
Monocytes Absolute: 0.4 10*3/uL (ref 0.1–1.0)
Monocytes Relative: 6 % (ref 3.0–12.0)
Neutro Abs: 3.7 10*3/uL (ref 1.4–7.7)
Neutrophils Relative %: 59.8 % (ref 43.0–77.0)
Platelets: 330 10*3/uL (ref 150.0–400.0)
RBC: 4.5 Mil/uL (ref 3.87–5.11)
RDW: 14.1 % (ref 11.5–15.5)
WBC: 6.1 10*3/uL (ref 4.0–10.5)

## 2023-01-19 LAB — HEMOGLOBIN A1C: Hgb A1c MFr Bld: 5.9 % (ref 4.6–6.5)

## 2023-01-19 LAB — LIPID PANEL
Cholesterol: 181 mg/dL (ref 0–200)
HDL: 51.3 mg/dL (ref >39.00)
LDL Cholesterol: 108 mg/dL — ABNORMAL HIGH (ref 0–99)
NonHDL: 129.65
Total CHOL/HDL Ratio: 4
Triglycerides: 107 mg/dL (ref 0.0–149.0)
VLDL: 21.4 mg/dL (ref 0.0–40.0)

## 2023-01-19 LAB — BASIC METABOLIC PANEL
BUN: 21 mg/dL (ref 6–23)
CO2: 27 mEq/L (ref 19–32)
Calcium: 9.4 mg/dL (ref 8.4–10.5)
Chloride: 106 mEq/L (ref 96–112)
Creatinine, Ser: 0.85 mg/dL (ref 0.40–1.20)
GFR: 81.65 mL/min (ref >60.00)
Glucose, Bld: 94 mg/dL (ref 70–99)
Potassium: 3.9 mEq/L (ref 3.5–5.1)
Sodium: 140 mEq/L (ref 135–145)

## 2023-01-19 LAB — TSH: TSH: 1.97 u[IU]/mL (ref 0.35–5.50)

## 2023-01-19 MED ORDER — BUPROPION HCL ER (XL) 150 MG PO TB24: 150.0000 mg | ORAL_TABLET | Freq: Every day | ORAL | 5 refills | Status: DC

## 2023-01-19 NOTE — Progress Notes (Signed)
Subjective:    Patient ID: Helen Paul, female    DOB: 1975/09/28, 47 y.o.   MRN: [id number]  HPI Here for a well exam. She feels well physically, but she is interested in decreasing the dose of her Wellbutrin or even stopping it. As her dose of Vyvanse has gone up, she has felt more irritable, and the Wellbutrin could play a role. She wants to get a colonoscopy.    Review of Systems  Constitutional: Negative.   HENT: Negative.    Eyes: Negative.   Respiratory: Negative.    Cardiovascular: Negative.   Gastrointestinal: Negative.   Genitourinary:  Negative for decreased urine volume, difficulty urinating, dyspareunia, dysuria, enuresis, flank pain, frequency, hematuria, pelvic pain and urgency.  Musculoskeletal: Negative.   Skin: Negative.   Neurological: Negative.  Negative for headaches.  Psychiatric/Behavioral:  Positive for agitation. Negative for dysphoric mood and sleep disturbance. The patient is not nervous/anxious.        Objective:   Physical Exam Constitutional:      General: She is not in acute distress.    Appearance: She is well-developed. She is obese.  HENT:     Head: Normocephalic and atraumatic.     Right Ear: External ear normal.     Left Ear: External ear normal.     Nose: Nose normal.     Mouth/Throat:     Pharynx: No oropharyngeal exudate.  Eyes:     General: No scleral icterus.    Conjunctiva/sclera: Conjunctivae normal.     Pupils: Pupils are equal, round, and reactive to light.  Neck:     Thyroid: No thyromegaly.     Vascular: No JVD.  Cardiovascular:     Rate and Rhythm: Normal rate and regular rhythm.     Pulses: Normal pulses.     Heart sounds: Normal heart sounds. No murmur heard.    No friction rub. No gallop.  Pulmonary:     Effort: Pulmonary effort is normal. No respiratory distress.     Breath sounds: Normal breath sounds. No wheezing or rales.  Chest:     Chest wall: No tenderness.  Abdominal:     General: Bowel sounds are  normal. There is no distension.     Palpations: Abdomen is soft. There is no mass.     Tenderness: There is no abdominal tenderness. There is no guarding or rebound.  Musculoskeletal:        General: No tenderness. Normal range of motion.     Cervical back: Normal range of motion and neck supple.  Lymphadenopathy:     Cervical: No cervical adenopathy.  Skin:    General: Skin is warm and dry.     Findings: No erythema or rash.  Neurological:     General: No focal deficit present.     Mental Status: She is alert and oriented to person, place, and time.     Cranial Nerves: No cranial nerve deficit.     Motor: No abnormal muscle tone.     Coordination: Coordination normal.     Deep Tendon Reflexes: Reflexes are normal and symmetric. Reflexes normal.  Psychiatric:        Mood and Affect: Mood normal.        Behavior: Behavior normal.        Thought Content: Thought content normal.        Judgment: Judgment normal.           Assessment & Plan:  Well exam. We discussed  diet and exercise. Get fasting labs. We will decrease the Wellbutrin XL to 150 mg daily. Set up a colonoscopy. Gershon Crane, MD

## 2023-01-20 DIAGNOSIS — F411 Generalized anxiety disorder: Secondary | ICD-10-CM | POA: Diagnosis not present

## 2023-02-03 DIAGNOSIS — F411 Generalized anxiety disorder: Secondary | ICD-10-CM | POA: Diagnosis not present

## 2023-02-10 ENCOUNTER — Other Ambulatory Visit: Payer: Self-pay | Admitting: Family Medicine

## 2023-02-24 DIAGNOSIS — F411 Generalized anxiety disorder: Secondary | ICD-10-CM | POA: Diagnosis not present

## 2023-03-11 DIAGNOSIS — F411 Generalized anxiety disorder: Secondary | ICD-10-CM | POA: Diagnosis not present

## 2023-04-17 DIAGNOSIS — F411 Generalized anxiety disorder: Secondary | ICD-10-CM | POA: Diagnosis not present

## 2023-05-04 DIAGNOSIS — F411 Generalized anxiety disorder: Secondary | ICD-10-CM | POA: Diagnosis not present

## 2023-05-08 DIAGNOSIS — Z79899 Other long term (current) drug therapy: Secondary | ICD-10-CM | POA: Diagnosis not present

## 2023-05-08 DIAGNOSIS — F902 Attention-deficit hyperactivity disorder, combined type: Secondary | ICD-10-CM | POA: Diagnosis not present

## 2023-05-11 DIAGNOSIS — F411 Generalized anxiety disorder: Secondary | ICD-10-CM | POA: Diagnosis not present

## 2023-05-18 DIAGNOSIS — F411 Generalized anxiety disorder: Secondary | ICD-10-CM | POA: Diagnosis not present

## 2023-05-19 ENCOUNTER — Encounter: Payer: Self-pay | Admitting: Family Medicine

## 2023-05-20 MED ORDER — ALPRAZOLAM 0.5 MG PO TABS: 0.5000 mg | ORAL_TABLET | Freq: Two times a day (BID) | ORAL | 5 refills | Status: DC | PRN

## 2023-05-20 NOTE — Telephone Encounter (Signed)
At this pint she can just stop the Bupropion. I did send in refills for the Xanax

## 2023-05-21 DIAGNOSIS — F902 Attention-deficit hyperactivity disorder, combined type: Secondary | ICD-10-CM | POA: Diagnosis not present

## 2023-05-26 DIAGNOSIS — F411 Generalized anxiety disorder: Secondary | ICD-10-CM | POA: Diagnosis not present

## 2023-06-02 DIAGNOSIS — F411 Generalized anxiety disorder: Secondary | ICD-10-CM | POA: Diagnosis not present

## 2023-08-21 ENCOUNTER — Encounter: Payer: Self-pay | Admitting: Plastic Surgery

## 2023-08-21 ENCOUNTER — Ambulatory Visit: Payer: BC Managed Care – PPO | Admitting: Plastic Surgery

## 2023-08-21 VITALS — BP 104/69 | HR 77 | Ht 65.0 in | Wt 221.8 lb

## 2023-08-21 DIAGNOSIS — Z6837 Body mass index (BMI) 37.0-37.9, adult: Secondary | ICD-10-CM

## 2023-08-21 DIAGNOSIS — G8929 Other chronic pain: Secondary | ICD-10-CM

## 2023-08-21 DIAGNOSIS — M549 Dorsalgia, unspecified: Secondary | ICD-10-CM | POA: Insufficient documentation

## 2023-08-21 DIAGNOSIS — N62 Hypertrophy of breast: Secondary | ICD-10-CM

## 2023-08-21 DIAGNOSIS — M546 Pain in thoracic spine: Secondary | ICD-10-CM

## 2023-08-21 DIAGNOSIS — M545 Low back pain, unspecified: Secondary | ICD-10-CM

## 2023-08-21 DIAGNOSIS — M542 Cervicalgia: Secondary | ICD-10-CM

## 2023-08-21 NOTE — Progress Notes (Signed)
cor    Patient ID: Helen Paul, female    DOB: September 05, 1975, 48 y.o.   MRN: [id number]   Chief Complaint  Patient presents with   Consult    Mammary Hyperplasia: The patient is a 48 y.o. female with a history of mammary hyperplasia for several years.  She has extremely large breasts causing symptoms that include the following: Back pain in the upper and lower back, including neck pain. She pulls or pins her bra straps to provide better lift and relief of the pressure and pain. She notices relief by holding her breast up manually.  Her shoulder straps cause grooves and pain and pressure that requires padding for relief. Pain medication is sometimes required with motrin and tylenol.  Activities that are hindered by enlarged breasts include: exercise and running.  She has tried supportive clothing as well as fitted bras without improvement.  Her breasts are extremely large and fairly symmetric.  She has hyperpigmentation of the inframammary area on both sides.  The sternal to nipple distance on the right is 36 cm and the left is 35 cm.  The IMF distance is 18 cm.  She is 5 feet 5 inches tall and weighs 228 pounds.  The BMI = 37.9 kg/m.  Preoperative bra size = J cup.  The estimated excess breast tissue to be removed at the time of surgery = 750 grams on the left and 750 grams on the right.  Mammogram history: 2025 negative.  Family history of breast cancer:  none.  Tobacco use:  none.   The patient expresses the desire to pursue surgical intervention.  1C was done in July 2024 and was 5.9.  Patient had child her first 1 at the age of 25 and breast-feed but it it was difficult to do.  She is otherwise in good health.     Review of Systems  Constitutional:  Positive for activity change. Negative for appetite change.  HENT: Negative.    Eyes: Negative.   Respiratory: Negative.    Cardiovascular: Negative.   Gastrointestinal: Negative.   Endocrine: Negative.   Genitourinary: Negative.    Musculoskeletal:  Positive for back pain and neck pain.  Skin:  Positive for rash.    Past Medical History:  Diagnosis Date   Anxiety    Avascular necrosis of hip (HCC)    Depression    Hip pain, bilateral    Hyperlipidemia    Migraines    Sleep apnea     Past Surgical History:  Procedure Laterality Date   JOINT REPLACEMENT  03-18-11   left total hip, per Dr. Nancy Fetter at [facility] GASTROPLASTY  01-13-14   per Dr. Lily Peer at [facility] TOOTH EXTRACTION        Current Outpatient Medications:    ALPRAZolam (XANAX) 0.5 MG tablet, Take 1 tablet (0.5 mg total) by mouth 2 (two) times daily as needed for anxiety or sleep., Disp: 60 tablet, Rfl: 5   Cetirizine HCl (KLS ALLER-TEC PO), Take by mouth as needed., Disp: , Rfl:    levocetirizine (XYZAL) 5 MG tablet, Take 5 mg by mouth as needed for allergies., Disp: , Rfl:    Lisdexamfetamine Dimesylate 60 MG CHEW, Chew 60 mg by mouth daily., Disp: , Rfl:    VYVANSE 60 MG capsule, Take 1 tablet by mouth daily., Disp: , Rfl:    levonorgestrel (MIRENA, 52 MG,) 20 MCG/24HR IUD, 1 Intra Uterine Device (1 each total) by Intrauterine route  once., Disp: 1 each, Rfl: 0   Objective:   Vitals:   08/21/23 0810  BP: 104/69  Pulse: 77  SpO2: 96%    Physical Exam Vitals and nursing note reviewed.  Constitutional:      Appearance: Normal appearance.  HENT:     Head: Atraumatic.  Cardiovascular:     Rate and Rhythm: Normal rate.     Pulses: Normal pulses.  Pulmonary:     Effort: Pulmonary effort is normal.  Abdominal:     General: There is no distension.     Palpations: Abdomen is soft.  Musculoskeletal:        General: No swelling or deformity.  Skin:    General: Skin is warm.     Capillary Refill: Capillary refill takes less than 2 seconds.     Coloration: Skin is not jaundiced or pale.     Findings: No bruising.  Neurological:     Mental Status: She is alert and oriented to person, place, and time.   Psychiatric:        Mood and Affect: Mood normal.        Behavior: Behavior normal.        Thought Content: Thought content normal.        Judgment: Judgment normal.     Assessment & Plan:  Chronic bilateral thoracic back pain  Neck pain  Large breasts  The procedure the patient selected and that was best for the patient was discussed. The risk were discussed and include but not limited to the following:  Breast asymmetry, fluid accumulation, firmness of the breast, inability to breast feed, loss of nipple or areola, skin loss, change in skin and nipple sensation, fat necrosis of the breast tissue, bleeding, infection and healing delay.  There are risks of anesthesia and injury to nerves or blood vessels.  Allergic reaction to tape, suture and skin glue are possible.  There will be swelling.  Any of these can lead to the need for revisional surgery which is not included in this surgery.  A breast reduction has potential to interfere with diagnostic procedures in the future.  This procedure is best done when the breast is fully developed.  Changes in the breast will continue to occur over time: pregnancy, weight gain or weigh loss. No guarantees are given for a certain bra or breast size.    Total time: 40 minutes. This includes time spent with the patient during the visit as well as time spent before and after the visit reviewing the chart, documenting the encounter, ordering pertinent studies and literature for the patient.   Physical therapy:  ordered Mammogram:  request copy [facility]:  referral placed  The patient is a good candidate for bilateral breast reduction with lateral liposuction.  I think we will be able to get this submitted to insurance after she gets through the physical therapy and comes back to see [place].  Pictures were obtained of the patient and placed in the chart with the patient's or guardian's permission.   Alena Bills Fadil Macmaster, DO

## 2023-09-10 ENCOUNTER — Encounter: Payer: BC Managed Care – PPO | Admitting: Bariatrics

## 2023-09-15 NOTE — Therapy (Unsigned)
 OUTPATIENT PHYSICAL THERAPY THORACOLUMBAR EVALUATION   Patient Name: Helen Paul MRN: [id number] DOB:08/24/1975, 48 y.o., female Today's Date: 09/16/2023  END OF SESSION:  PT End of Session - 09/16/23 1115     Visit Number 1    Number of Visits 6    PT Start Time 1105    PT Stop Time 1145    PT Time Calculation (min) 40 min    Activity Tolerance Patient tolerated treatment well    Behavior During Therapy WFL for tasks assessed/performed             Past Medical History:  Diagnosis Date   Anxiety    Avascular necrosis of hip (HCC)    Depression    Hip pain, bilateral    Hyperlipidemia    Migraines    Sleep apnea    Past Surgical History:  Procedure Laterality Date   JOINT REPLACEMENT  03-18-11   left total hip, per Dr. Nancy Fetter at [facility] GASTROPLASTY  01-13-14   per Dr. Lily Peer at [facility] TOOTH EXTRACTION     Patient Active Problem List   Diagnosis Date Noted   Back pain 08/21/2023   Neck pain 12/27/2021   Large breasts 12/27/2021   ADHD (attention deficit hyperactivity disorder), inattentive type 11/27/2021   Depression with anxiety 02/14/2019   Insomnia 01/02/2015   Morbid obesity (HCC) 01/20/2013   OSA (obstructive sleep apnea) 02/21/2011    PCP: Gershon Crane MD   REFERRING PROVIDER: Foster Simpson DO   REFERRING DIAG:  [phone] (ICD-10-CM) - Chronic bilateral thoracic back pain  M54.2 (ICD-10-CM) - Neck pain  N62 (ICD-10-CM) - Large breasts    Rationale for Evaluation and Treatment: Rehabilitation  THERAPY DIAG:  Abnormal posture  Cervicalgia  ONSET DATE: chronic   SUBJECTIVE:                                                                                                                                                                                           SUBJECTIVE STATEMENT: Patient is here for neck and upper back pain due to mammary hypertrophy.  She is unable to participate in recreation  activities due to discomfort and pain.  She cannot be as active as she wants to be.  She is unable to sleep on her back, pain and pressure, and in sidelying as well.  She has had skin irritation as well.  She has increase tension in upper back.  She sometimes has numbness/tingling in her hands when she wakes up, more recently.   PERTINENT HISTORY:  Hip replacemment   PAIN:  Are you having  pain? Yes: NPRS scale: 5/10 can be as high as 8/10 Pain location: neck and upper back  Pain description: tight and tense  Aggravating factors: sitting , malposition for long time   Relieving factors: massage gun, heating pad, OTC meds   PRECAUTIONS: Other: none   RED FLAGS: None   WEIGHT BEARING RESTRICTIONS: No  FALLS:  Has patient fallen in last 6 months? No  LIVING ENVIRONMENT: Lives with: lives with their spouse Lives in: House/apartment Stairs: none Has following equipment at home:  none   OCCUPATION: Full time at [employer]   PLOF: Independent  PATIENT GOALS: Breast reduction surgery  NEXT MD VISIT: after PT   OBJECTIVE:  Note: Objective measures were completed at Evaluation unless otherwise noted.  DIAGNOSTIC FINDINGS:  None   PATIENT SURVEYS:  NDI 12/50  COGNITION: Overall cognitive status: Within functional limits for tasks assessed     SENSATION: Normal but does have numbness in fingers at times with malposition    POSTURE: rounded shoulders, forward head, and decreased lumbar lordosis GH joint internally rotated bilateral   PALPATION: Tension and trigger points in upper traps, levator scap  LUMBAR ROM:  WFL stiff in rotation  AROM eval  Flexion   Extension   Right lateral flexion   Left lateral flexion   Right rotation   Left rotation    (Blank rows = not tested)  UPPER EXTREMITY ROM:   WNL    CERVICAL ROM:   Active ROM A/PROM (deg) 09/16/2023  Flexion 50  Extension 55  Right lateral flexion 45  Left lateral flexion 45  Right rotation WNL   Left rotation WNL   (Blank rows = not tested)  UE ROM:WNL  Decreased postural endurance   UE MMT Right 09/16/2023 Left 09/16/2023  Shoulder flexion 4 4  Shoulder extension    Shoulder abduction 4 4  Shoulder adduction    Shoulder extension    Shoulder internal rotation    Shoulder external rotation    Middle trapezius    Lower trapezius    Elbow flexion    Elbow extension    Wrist flexion    Wrist extension    Wrist ulnar deviation    Wrist radial deviation    Wrist pronation    Wrist supination    Grip strength     (Blank rows = not tested)    GAIT: Distance walked: WNL  Assistive device utilized: None Level of assistance:  NONE Comments: I   TREATMENT DATE:  [facility] Adult PT Treatment:                                                DATE: 09/16/23 Therapeutic Activity: Used wall for posture Horizontal abduction green band x 10  ER x 10  Row  x 10  Extension x 10  Corner stretch 30 sec x 3 Self Care: POC, financial issues with BCBS plan               Posture, tennis ball  PATIENT EDUCATION:  Education details: posture, HEP, tennis ball  Person educated: Patient Education method: Explanation Education comprehension: verbalized understanding, returned demonstration, and needs further education  HOME EXERCISE PROGRAM: Access Code: [facility] URL: [email or web address] Date: 09/16/2023 Prepared by: Karie Mainland  Exercises - Standing Shoulder Row with Anchored Resistance  - 1 x daily - 7 x weekly - 2 sets - 10 reps - 5 hold - Shoulder extension with resistance - Neutral  - 1 x daily - 7 x weekly - 2 sets - 10 reps - 5 hold - Shoulder External Rotation and Scapular Retraction with Resistance  - 1 x daily - 7 x weekly - 2 sets - 10 reps - 5 hold - Standing Shoulder Horizontal Abduction with Resistance  - 1 x daily - 7 x weekly - 2  sets - 10 reps - 5 hold - Corner Pec Major Stretch  - 1 x daily - 7 x weekly - 1 sets - 3 reps - 30 hold  ASSESSMENT:  CLINICAL IMPRESSION: Patient is a 48 y.o. female who was seen today for physical therapy evaluation and treatment for upper back and neck pain due to symptomatic mammary hypertrophy.   OBJECTIVE IMPAIRMENTS: decreased activity tolerance, decreased strength, increased fascial restrictions, impaired UE functional use, postural dysfunction, obesity, and pain.   ACTIVITY LIMITATIONS: carrying, lifting, bending, sitting, standing, bed mobility, and locomotion level  PARTICIPATION LIMITATIONS: interpersonal relationship, community activity, and occupation  PERSONAL FACTORS: Time since onset of injury/illness/exacerbation and 1 comorbidity: hip replacement   are also affecting patient's functional outcome.   REHAB POTENTIAL: Excellent  CLINICAL DECISION MAKING: Evolving/moderate complexity  EVALUATION COMPLEXITY: Moderate   GOALS:   LONG TERM GOALS: Target date: 10/28/2023    Pt will be I with HEP for posture and core Baseline:  Goal status: INITIAL  2.  Pt will be able to show postural correction and maintain for upper body exercise for > 15 reps  Baseline:  Goal status: INITIAL  3.  Pt will be able to make accomodation for work area (desk, screen and chair) to optimize work performance.  Baseline:  Goal status: INITIAL  4.  Pt will score NDI 19/50 as a proxy for improved neck and UE function.  Baseline: 12/50 Goal status: INITIAL  5.  Functional goals TBA  Baseline:  Goal status: INITIAL PLAN:  PT FREQUENCY: 1x/week  PT DURATION: 6 weeks  PLANNED INTERVENTIONS: 97164- PT Re-evaluation, 97110-Therapeutic exercises, 97530- Therapeutic activity, 97112- Neuromuscular re-education, 97535- Self Care, 16109- Manual therapy, Patient/Family education, Taping, Dry Needling, and Spinal mobilization.  PLAN FOR NEXT SESSION: check HEP, cont posture lifting and  ergonomics    Kwinton Maahs, PT 09/16/2023, 11:54 AM

## 2023-09-16 ENCOUNTER — Ambulatory Visit: Attending: Plastic Surgery | Admitting: Physical Therapy

## 2023-09-16 ENCOUNTER — Encounter: Payer: Self-pay | Admitting: Physical Therapy

## 2023-09-16 DIAGNOSIS — M546 Pain in thoracic spine: Secondary | ICD-10-CM | POA: Diagnosis not present

## 2023-09-16 DIAGNOSIS — N62 Hypertrophy of breast: Secondary | ICD-10-CM | POA: Diagnosis not present

## 2023-09-16 DIAGNOSIS — R293 Abnormal posture: Secondary | ICD-10-CM | POA: Diagnosis present

## 2023-09-16 DIAGNOSIS — G8929 Other chronic pain: Secondary | ICD-10-CM | POA: Insufficient documentation

## 2023-09-16 DIAGNOSIS — M542 Cervicalgia: Secondary | ICD-10-CM | POA: Diagnosis present

## 2023-10-08 ENCOUNTER — Ambulatory Visit: Admitting: Physical Therapy

## 2023-10-08 ENCOUNTER — Encounter: Payer: Self-pay | Admitting: Physical Therapy

## 2023-10-08 DIAGNOSIS — R293 Abnormal posture: Secondary | ICD-10-CM

## 2023-10-08 DIAGNOSIS — M542 Cervicalgia: Secondary | ICD-10-CM

## 2023-10-08 NOTE — Therapy (Signed)
 OUTPATIENT PHYSICAL THERAPY THORACOLUMBAR TREATMENT   Patient Name: TALINA PLEITEZ MRN: [id number] DOB:August 23, 1975, 48 y.o., female Today's Date: 10/08/2023  END OF SESSION:  PT End of Session - 10/08/23 0805     Visit Number 2    Number of Visits 6    PT Start Time 0806    PT Stop Time 0845    PT Time Calculation (min) 39 min    Activity Tolerance Patient tolerated treatment well    Behavior During Therapy [facility] for tasks assessed/performed             Past Medical History:  Diagnosis Date   Anxiety    Avascular necrosis of hip (HCC)    Depression    Hip pain, bilateral    Hyperlipidemia    Migraines    Sleep apnea    Past Surgical History:  Procedure Laterality Date   JOINT REPLACEMENT  03-18-11   left total hip, per Dr. Nancy Fetter at [facility] GASTROPLASTY  01-13-14   per Dr. Lily Peer at [facility] TOOTH EXTRACTION     Patient Active Problem List   Diagnosis Date Noted   Back pain 08/21/2023   Neck pain 12/27/2021   Large breasts 12/27/2021   ADHD (attention deficit hyperactivity disorder), inattentive type 11/27/2021   Depression with anxiety 02/14/2019   Insomnia 01/02/2015   Morbid obesity (HCC) 01/20/2013   OSA (obstructive sleep apnea) 02/21/2011    PCP: Gershon Crane MD   REFERRING PROVIDER: Foster Simpson DO   REFERRING DIAG:  [phone] (ICD-10-CM) - Chronic bilateral thoracic back pain  M54.2 (ICD-10-CM) - Neck pain  N62 (ICD-10-CM) - Large breasts    Rationale for Evaluation and Treatment: Rehabilitation  THERAPY DIAG:  Abnormal posture  Cervicalgia  ONSET DATE: chronic   SUBJECTIVE:                                                                                                                                                                                           SUBJECTIVE STATEMENT: Pt states ongoing soreness in neck and upper back.   Eval: Patient is here for neck and upper back pain due to mammary  hypertrophy.  She is unable to participate in recreation activities due to discomfort and pain.  She cannot be as active as she wants to be.  She is unable to sleep on her back, pain and pressure, and in sidelying as well.  She has had skin irritation as well.  She has increase tension in upper back.  She sometimes has numbness/tingling in her hands when she wakes up, more recently.  PERTINENT HISTORY:  Hip replacemment   PAIN:  Are you having pain? Yes: NPRS scale: 5/10 can be as high as 8/10 Pain location: neck and upper back  Pain description: tight and tense  Aggravating factors: sitting , malposition for long time   Relieving factors: massage gun, heating pad, OTC meds   PRECAUTIONS: Other: none   RED FLAGS: None   WEIGHT BEARING RESTRICTIONS: No  FALLS:  Has patient fallen in last 6 months? No  LIVING ENVIRONMENT: Lives with: lives with their spouse Lives in: House/apartment Stairs: none Has following equipment at home:  none   OCCUPATION: Full time at [employer]   PLOF: Independent  PATIENT GOALS: Breast reduction surgery  NEXT MD VISIT: after PT   OBJECTIVE:  Note: Objective measures were completed at Evaluation unless otherwise noted.  DIAGNOSTIC FINDINGS:  None   PATIENT SURVEYS:  NDI 12/50  COGNITION: Overall cognitive status: Within functional limits for tasks assessed     SENSATION: Normal but does have numbness in fingers at times with malposition    POSTURE: rounded shoulders, forward head, and decreased lumbar lordosis GH joint internally rotated bilateral   PALPATION: Tension and trigger points in upper traps, levator scap  LUMBAR ROM:  WFL stiff in rotation  AROM eval  Flexion   Extension   Right lateral flexion   Left lateral flexion   Right rotation   Left rotation    (Blank rows = not tested)  UPPER EXTREMITY ROM:   WNL    CERVICAL ROM:   Active ROM A/PROM (deg) 09/16/2023  Flexion 50  Extension 55  Right lateral  flexion 45  Left lateral flexion 45  Right rotation WNL  Left rotation WNL   (Blank rows = not tested)  UE ROM:WNL  Decreased postural endurance   UE MMT Right 09/16/2023 Left 09/16/2023  Shoulder flexion 4 4  Shoulder extension    Shoulder abduction 4 4  Shoulder adduction    Shoulder extension    Shoulder internal rotation    Shoulder external rotation    Middle trapezius    Lower trapezius    Elbow flexion    Elbow extension    Wrist flexion    Wrist extension    Wrist ulnar deviation    Wrist radial deviation    Wrist pronation    Wrist supination    Grip strength     (Blank rows = not tested)    GAIT: Distance walked: WNL  Assistive device utilized: None Level of assistance:  NONE Comments: I   TREATMENT DATE:  [facility] Adult PT Treatment:                                               10/08/2023 Therapeutic Exercise: Aerobic: Supine:  Seated:  cervical rotation x 10; flexion x 5;  UT stretch 20 sec x 3 bil;   Standing:  scap squeeze x 10/arms flexed sides and x 10 arms down at sides;  Rows: GTB 2 x 10 with education on posture and body position;  Stretches:  Neuromuscular Re-education: supine: chin tucks x 10 with cuing for head posture Standing at wall: towel behind head - chin tuck x 10 cueing for upright posture Education and practice for optimal seated posture without posterior or anterior pelvic tilt.  Manual Therapy: Manual UT and levator stretches, upper Thoracic and cervical PA  mobs ;  Therapeutic Activity: Self Care:   Eval: Therapeutic Activity: Used wall for posture Horizontal abduction green band x 10  ER x 10  Row  x 10  Extension x 10  Corner stretch 30 sec x 3 Self Care: POC, financial issues with [employer]BS plan               Posture, tennis ball                                                                                                                      PATIENT EDUCATION:  Education details: reviewed HEP, posture, PT course,   Person educated: Patient Education method: Explanation Education comprehension: verbalized understanding, returned demonstration, and needs further education  HOME EXERCISE PROGRAM: Access Code: [email or web address]  ASSESSMENT:  CLINICAL IMPRESSION: 10/08/2023 Pt with tendency for fwd head and rounded back posture. She will continue to benefit from education on neutral spine posture, as well as strengthening for postural muscles for decreasing tension on neck and back. Pt with good tolerance for exercises today.   Eval: Patient is a 48 y.o. female who was seen today for physical therapy evaluation and treatment for upper back and neck pain due to symptomatic mammary hypertrophy.   OBJECTIVE IMPAIRMENTS: decreased activity tolerance, decreased strength, increased fascial restrictions, impaired UE functional use, postural dysfunction, obesity, and pain.   ACTIVITY LIMITATIONS: carrying, lifting, bending, sitting, standing, bed mobility, and locomotion level  PARTICIPATION LIMITATIONS: interpersonal relationship, community activity, and occupation  PERSONAL FACTORS: Time since onset of injury/illness/exacerbation and 1 comorbidity: hip replacement   are also affecting patient's functional outcome.   REHAB POTENTIAL: Excellent  CLINICAL DECISION MAKING: Evolving/moderate complexity  EVALUATION COMPLEXITY: Moderate   GOALS:   LONG TERM GOALS: Target date: 10/28/2023    Pt will be I with HEP for posture and core Baseline:  Goal status: INITIAL  2.  Pt will be able to show postural correction and maintain for upper body exercise for > 15 reps  Baseline:  Goal status: INITIAL  3.  Pt will be able to make accomodation for work area (desk, screen and chair) to optimize work performance.  Baseline:  Goal status: INITIAL  4.  Pt will score NDI 19/50 as a proxy for improved neck and UE function.  Baseline: 12/50 Goal status: INITIAL  5.  Functional goals TBA  Baseline:  Goal status:  INITIAL PLAN:  PT FREQUENCY: 1x/week  PT DURATION: 6 weeks  PLANNED INTERVENTIONS: 97164- PT Re-evaluation, 97110-Therapeutic exercises, 97530- Therapeutic activity, 97112- Neuromuscular re-education, 97535- Self Care, 04540- Manual therapy, Patient/Family education, Taping, Dry Needling, and Spinal mobilization.  PLAN FOR NEXT SESSION: check HEP, cont posture lifting and ergonomics   Sedalia Muta, PT, DPT 11:21 AM  10/08/23

## 2023-10-21 ENCOUNTER — Encounter: Payer: Self-pay | Admitting: Physical Therapy

## 2023-10-21 ENCOUNTER — Ambulatory Visit: Admitting: Physical Therapy

## 2023-10-21 DIAGNOSIS — R293 Abnormal posture: Secondary | ICD-10-CM

## 2023-10-21 DIAGNOSIS — M542 Cervicalgia: Secondary | ICD-10-CM

## 2023-10-21 NOTE — Therapy (Signed)
 OUTPATIENT PHYSICAL THERAPY THORACOLUMBAR TREATMENT   Patient Name: Helen Paul MRN: [id number] DOB:[birth year], 48 y.o., female Today's Date: 10/21/2023  END OF SESSION:  PT End of Session - 10/21/23 0805     Visit Number 3    Number of Visits 6    Date for PT Re-Evaluation 10/28/23    Authorization Type BCBS    Authorization - Number of Visits 6    PT Start Time 0805    PT Stop Time 0845    PT Time Calculation (min) 40 min    Activity Tolerance Patient tolerated treatment well    Behavior During Therapy [facility] for tasks assessed/performed             Past Medical History:  Diagnosis Date   Anxiety    Avascular necrosis of hip (HCC)    Depression    Hip pain, bilateral    Hyperlipidemia    Migraines    Sleep apnea    Past Surgical History:  Procedure Laterality Date   JOINT REPLACEMENT  03-18-11   left total hip, per Dr. Nancy Fetter at [facility] GASTROPLASTY  01-13-14   per Dr. Lily Peer at [facility] TOOTH EXTRACTION     Patient Active Problem List   Diagnosis Date Noted   Back pain 08/21/2023   Neck pain 12/27/2021   Large breasts 12/27/2021   ADHD (attention deficit hyperactivity disorder), inattentive type 11/27/2021   Depression with anxiety 02/14/2019   Insomnia 01/02/2015   Morbid obesity (HCC) 01/20/2013   OSA (obstructive sleep apnea) 02/21/2011    PCP: Gershon Crane MD   REFERRING PROVIDER: Foster Simpson DO   REFERRING DIAG:  [phone] (ICD-10-CM) - Chronic bilateral thoracic back pain  M54.2 (ICD-10-CM) - Neck pain  N62 (ICD-10-CM) - Large breasts    Rationale for Evaluation and Treatment: Rehabilitation  THERAPY DIAG:  Abnormal posture  Cervicalgia  ONSET DATE: chronic   SUBJECTIVE:                                                                                                                                                                                           SUBJECTIVE STATEMENT: Pt states less  stress this week with work, and neck feels a bit better from this as well. Has been paying more attention to her work set up and posture, as well as doing HEP.  Eval: Patient is here for neck and upper back pain due to mammary hypertrophy.  She is unable to participate in recreation activities due to discomfort and pain.  She cannot be as active as she wants to be.  She is unable to sleep on her back, pain and pressure, and in sidelying as well.  She has had skin irritation as well.  She has increase tension in upper back.  She sometimes has numbness/tingling in her hands when she wakes up, more recently.   PERTINENT HISTORY:  Hip replacemment   PAIN:  Are you having pain? Yes: NPRS scale: 5/10 can be as high as 8/10 Pain location: neck and upper back  Pain description: tight and tense  Aggravating factors: sitting , malposition for long time   Relieving factors: massage gun, heating pad, OTC meds   PRECAUTIONS: Other: none   RED FLAGS: None   WEIGHT BEARING RESTRICTIONS: No  FALLS:  Has patient fallen in last 6 months? No  LIVING ENVIRONMENT: Lives with: lives with their spouse Lives in: House/apartment Stairs: none Has following equipment at home:  none   OCCUPATION: Full time at [employer]   PLOF: Independent  PATIENT GOALS: Breast reduction surgery  NEXT MD VISIT: after PT   OBJECTIVE:  Note: Objective measures were completed at Evaluation unless otherwise noted.  DIAGNOSTIC FINDINGS:  None   PATIENT SURVEYS:  NDI 12/50  COGNITION: Overall cognitive status: Within functional limits for tasks assessed     SENSATION: Normal but does have numbness in fingers at times with malposition    POSTURE: rounded shoulders, forward head, and decreased lumbar lordosis GH joint internally rotated bilateral   PALPATION: Tension and trigger points in upper traps, levator scap  LUMBAR ROM:  WFL stiff in rotation  AROM eval  Flexion   Extension   Right lateral  flexion   Left lateral flexion   Right rotation   Left rotation    (Blank rows = not tested)  UPPER EXTREMITY ROM:   WNL    CERVICAL ROM:   Active ROM A/PROM (deg) 09/16/2023  Flexion 50  Extension 55  Right lateral flexion 45  Left lateral flexion 45  Right rotation WNL  Left rotation WNL   (Blank rows = not tested)  UE ROM:WNL  Decreased postural endurance   UE MMT Right 09/16/2023 Left 09/16/2023  Shoulder flexion 4 4  Shoulder extension    Shoulder abduction 4 4  Shoulder adduction    Shoulder extension    Shoulder internal rotation    Shoulder external rotation    Middle trapezius    Lower trapezius    Elbow flexion    Elbow extension    Wrist flexion    Wrist extension    Wrist ulnar deviation    Wrist radial deviation    Wrist pronation    Wrist supination    Grip strength     (Blank rows = not tested)    GAIT: Distance walked: WNL  Assistive device utilized: None Level of assistance:  NONE Comments: I   TREATMENT DATE:  [facility] Adult PT Treatment:                                               10/21/2023 Therapeutic Exercise: Aerobic: Supine:  Seated:  cervical rotation x 10;   UT and levator stretch 20 sec x 3 bil;  chin tucks x 10;  Standing:    Stretches:   corner stretch 20 sec x 3;  Neuromuscular Re-education: wall push ups 2 x 10;  Rows: GTB 2 x 10 , low row Rtb  2 x10;  Scap pull apart/abd GTB 2 x 8;   Bil ER RTB 2 x 10; For strength and education on body position/back/neck/shoulders with all  Manual Therapy:  Therapeutic Activity:  Self Care:   Eval: Therapeutic Activity: Used wall for posture Horizontal abduction green band x 10  ER x 10  Row  x 10  Extension x 10  Corner stretch 30 sec x 3 Self Care: POC, financial issues with BCBS plan               Posture, tennis ball                                                                                                                      PATIENT EDUCATION:  Education  details: reviewed HEP, posture, PT course,  Person educated: Patient Education method: Explanation Education comprehension: verbalized understanding, returned demonstration, and needs further education  HOME EXERCISE PROGRAM: Access Code: [email or web address]  ASSESSMENT:  CLINICAL IMPRESSION: 10/21/2023 Pt with good ability for ther ex performed today. She requires mod cueing for body and head position with strengthening. Reviewed HEP in detail, pt will continue to benefit from strengthening and education on strength program for posture.   Eval: Patient is a 48 y.o. female who was seen today for physical therapy evaluation and treatment for upper back and neck pain due to symptomatic mammary hypertrophy.   OBJECTIVE IMPAIRMENTS: decreased activity tolerance, decreased strength, increased fascial restrictions, impaired UE functional use, postural dysfunction, obesity, and pain.   ACTIVITY LIMITATIONS: carrying, lifting, bending, sitting, standing, bed mobility, and locomotion level  PARTICIPATION LIMITATIONS: interpersonal relationship, community activity, and occupation  PERSONAL FACTORS: Time since onset of injury/illness/exacerbation and 1 comorbidity: hip replacement   are also affecting patient's functional outcome.   REHAB POTENTIAL: Excellent  CLINICAL DECISION MAKING: Evolving/moderate complexity  EVALUATION COMPLEXITY: Moderate   GOALS:   LONG TERM GOALS: Target date: 10/28/2023    Pt will be I with HEP for posture and core Baseline:  Goal status: INITIAL  2.  Pt will be able to show postural correction and maintain for upper body exercise for > 15 reps  Baseline:  Goal status: INITIAL  3.  Pt will be able to make accomodation for work area (desk, screen and chair) to optimize work performance.  Baseline:  Goal status: INITIAL  4.  Pt will score NDI 19/50 as a proxy for improved neck and UE function.  Baseline: 12/50 Goal status: INITIAL  5.  Functional goals TBA   Baseline:  Goal status: INITIAL PLAN:  PT FREQUENCY: 1x/week  PT DURATION: 6 weeks  PLANNED INTERVENTIONS: 97164- PT Re-evaluation, 97110-Therapeutic exercises, 97530- Therapeutic activity, 97112- Neuromuscular re-education, 97535- Self Care, [id number]- Manual therapy, Patient/Family education, Taping, Dry Needling, and Spinal mobilization.  PLAN FOR NEXT SESSION:   Sedalia Muta, PT, DPT 8:11 AM  10/21/23

## 2023-10-23 ENCOUNTER — Ambulatory Visit: Payer: BC Managed Care – PPO | Admitting: Student

## 2023-10-27 ENCOUNTER — Encounter: Payer: Self-pay | Admitting: Physical Therapy

## 2023-10-27 ENCOUNTER — Ambulatory Visit: Admitting: Physical Therapy

## 2023-10-27 DIAGNOSIS — M542 Cervicalgia: Secondary | ICD-10-CM

## 2023-10-27 DIAGNOSIS — R293 Abnormal posture: Secondary | ICD-10-CM

## 2023-10-27 NOTE — Therapy (Signed)
 OUTPATIENT PHYSICAL THERAPY THORACOLUMBAR TREATMENT   Patient Name: Helen Paul MRN: [id number] DOB:December 09, 1975, 48 y.o., female Today's Date: 10/27/2023  END OF SESSION:  PT End of Session - 10/27/23 0802     Visit Number 4    Number of Visits 6    Date for PT Re-Evaluation 10/28/23    Authorization Type BCBS    Authorization - Number of Visits 6    PT Start Time 0803    PT Stop Time 0845    PT Time Calculation (min) 42 min    Activity Tolerance Patient tolerated treatment well    Behavior During Therapy [facility] for tasks assessed/performed             Past Medical History:  Diagnosis Date   Anxiety    Avascular necrosis of hip (HCC)    Depression    Hip pain, bilateral    Hyperlipidemia    Migraines    Sleep apnea    Past Surgical History:  Procedure Laterality Date   JOINT REPLACEMENT  03-18-11   left total hip, per Dr. Rosemary Conrad at [facility] GASTROPLASTY  01-13-14   per Dr. Annitta Kindler at [facility] TOOTH EXTRACTION     Patient Active Problem List   Diagnosis Date Noted   Back pain 08/21/2023   Neck pain 12/27/2021   Large breasts 12/27/2021   ADHD (attention deficit hyperactivity disorder), inattentive type 11/27/2021   Depression with anxiety 02/14/2019   Insomnia 01/02/2015   Morbid obesity (HCC) 01/20/2013   OSA (obstructive sleep apnea) 02/21/2011    PCP: Corita Diego MD   REFERRING PROVIDER: Gilles Lacks DO   REFERRING DIAG:  [phone] (ICD-10-CM) - Chronic bilateral thoracic back pain  M54.2 (ICD-10-CM) - Neck pain  N62 (ICD-10-CM) - Large breasts    Rationale for Evaluation and Treatment: Rehabilitation  THERAPY DIAG:  Abnormal posture  Cervicalgia  ONSET DATE: chronic   SUBJECTIVE:                                                                                                                                                                                           SUBJECTIVE STATEMENT: Pt states doing a  bit better overall. Trying to pay attention to her posture.   Eval: Patient is here for neck and upper back pain due to mammary hypertrophy.  She is unable to participate in recreation activities due to discomfort and pain.  She cannot be as active as she wants to be.  She is unable to sleep on her back, pain and pressure, and in sidelying as well.  She has had  skin irritation as well.  She has increase tension in upper back.  She sometimes has numbness/tingling in her hands when she wakes up, more recently.   PERTINENT HISTORY:  Hip replacemment (48 y/o)   PAIN:  Are you having pain? Yes: NPRS scale: 5/10 can be as high as 8/10 Pain location: neck and upper back  Pain description: tight and tense  Aggravating factors: sitting , malposition for long time   Relieving factors: massage gun, heating pad, OTC meds   PRECAUTIONS: Other: none   RED FLAGS: None   WEIGHT BEARING RESTRICTIONS: No  FALLS:  Has patient fallen in last 6 months? No  LIVING ENVIRONMENT: Lives with: lives with their spouse Lives in: House/apartment Stairs: none Has following equipment at home:  none   OCCUPATION: Full time at [employer]   PLOF: Independent  PATIENT GOALS: Breast reduction surgery  NEXT MD VISIT: after PT   OBJECTIVE:  Note: Objective measures were completed at Evaluation unless otherwise noted.  DIAGNOSTIC FINDINGS:  None   PATIENT SURVEYS:  NDI 12/50  COGNITION: Overall cognitive status: Within functional limits for tasks assessed     SENSATION: Normal but does have numbness in fingers at times with malposition    POSTURE: rounded shoulders, forward head, and decreased lumbar lordosis GH joint internally rotated bilateral   PALPATION: Tension and trigger points in upper traps, levator scap  LUMBAR ROM:  WFL stiff in rotation  AROM eval  Flexion   Extension   Right lateral flexion   Left lateral flexion   Right rotation   Left rotation    (Blank rows = not  tested)  UPPER EXTREMITY ROM:   WNL    CERVICAL ROM:   Active ROM A/PROM (deg) 09/16/2023  Flexion 50  Extension 55  Right lateral flexion 45  Left lateral flexion 45  Right rotation WNL  Left rotation WNL   (Blank rows = not tested)  UE ROM:WNL  Decreased postural endurance   UE MMT Right 09/16/2023 Left 09/16/2023  Shoulder flexion 4 4  Shoulder extension    Shoulder abduction 4 4  Shoulder adduction    Shoulder extension    Shoulder internal rotation    Shoulder external rotation    Middle trapezius    Lower trapezius    Elbow flexion    Elbow extension    Wrist flexion    Wrist extension    Wrist ulnar deviation    Wrist radial deviation    Wrist pronation    Wrist supination    Grip strength     (Blank rows = not tested)    GAIT: Distance walked: WNL  Assistive device utilized: None Level of assistance:  NONE Comments: I   TREATMENT DATE:  [facility] Adult PT Treatment:                                               10/27/2023 Therapeutic Exercise: Aerobic: Supine:  SLR x 15 bil;  Bridging x 10;  Seated:   chin tucks x 10;  Standing:    Stretches:  doorway stretch 20 sec x 3;  Neuromuscular Re-education:  wall push ups 2 x 10;  Rows: GTB 2 x 10 , low row Rtb 2 x10;  Scap pull apart/abd GTB 2 x 8;   Bil ER RTB 2 x 10; Standing at wall for posture- chin  tuck and scap squeeze 5 sec x 20;  For strength and education on body position/back/neck/shoulders with all  Manual Therapy:  Therapeutic Activity:  Self Care:   Therapeutic Exercise: Aerobic: Supine:  Seated:  cervical rotation x 10;   UT and levator stretch 20 sec x 3 bil;  chin tucks x 10;  Standing:    Stretches:   corner stretch 20 sec x 3;  Neuromuscular Re-education: wall push ups 2 x 10;  Rows: GTB 2 x 10 , low row Rtb 2 x10;  Scap pull apart/abd GTB 2 x 8;   Bil ER RTB 2 x 10; For strength and education on body position/back/neck/shoulders with all  Manual Therapy:  Therapeutic  Activity:  Self Care:    Eval: Therapeutic Activity: Used wall for posture Horizontal abduction green band x 10  ER x 10  Row  x 10  Extension x 10  Corner stretch 30 sec x 3 Self Care: POC, financial issues with BCBS plan               Posture, tennis ball                                                                                                                      PATIENT EDUCATION:  Education details: reviewed HEP, posture, PT course,  Person educated: Patient Education method: Explanation Education comprehension: verbalized understanding, returned demonstration, and needs further education  HOME EXERCISE PROGRAM: Access Code: [email or web address]  ASSESSMENT:  CLINICAL IMPRESSION: 10/27/2023  Pt with good ability for ther ex performed today. She requires mod cueing for body and head position with most exercises. Continued upper back strength with head/shoulder positioning. Reviewed TA contraction, pt with good ability to perform. Pt to benefit from progressive core and postural strengthening as tolerated.   Eval: Patient is a 48 y.o. female who was seen today for physical therapy evaluation and treatment for upper back and neck pain due to symptomatic mammary hypertrophy.   OBJECTIVE IMPAIRMENTS: decreased activity tolerance, decreased strength, increased fascial restrictions, impaired UE functional use, postural dysfunction, obesity, and pain.   ACTIVITY LIMITATIONS: carrying, lifting, bending, sitting, standing, bed mobility, and locomotion level  PARTICIPATION LIMITATIONS: interpersonal relationship, community activity, and occupation  PERSONAL FACTORS: Time since onset of injury/illness/exacerbation and 1 comorbidity: hip replacement   are also affecting patient's functional outcome.   REHAB POTENTIAL: Excellent  CLINICAL DECISION MAKING: Evolving/moderate complexity  EVALUATION COMPLEXITY: Moderate   GOALS:   LONG TERM GOALS: Target date: 10/28/2023    Pt  will be I with HEP for posture and core Baseline:  Goal status: INITIAL  2.  Pt will be able to show postural correction and maintain for upper body exercise for > 15 reps  Baseline:  Goal status: INITIAL  3.  Pt will be able to make accomodation for work area (desk, screen and chair) to optimize work performance.  Baseline:  Goal status: INITIAL  4.  Pt will score NDI 19/50 as a proxy for improved  neck and UE function.  Baseline: 12/50 Goal status: INITIAL  5.  Functional goals TBA  Baseline:  Goal status: INITIAL PLAN:  PT FREQUENCY: 1x/week  PT DURATION: 6 weeks  PLANNED INTERVENTIONS: 97164- PT Re-evaluation, 97110-Therapeutic exercises, 97530- Therapeutic activity, 97112- Neuromuscular re-education, 97535- Self Care, 40981- Manual therapy, Patient/Family education, Taping, Dry Needling, and Spinal mobilization.  PLAN FOR NEXT SESSION:   Terrilee Few, PT, DPT 8:03 AM  10/27/23

## 2023-11-03 ENCOUNTER — Encounter: Admitting: Physical Therapy

## 2023-11-10 ENCOUNTER — Ambulatory Visit (INDEPENDENT_AMBULATORY_CARE_PROVIDER_SITE_OTHER): Admitting: Physical Therapy

## 2023-11-10 ENCOUNTER — Encounter: Payer: Self-pay | Admitting: Physical Therapy

## 2023-11-10 DIAGNOSIS — M542 Cervicalgia: Secondary | ICD-10-CM

## 2023-11-10 DIAGNOSIS — R293 Abnormal posture: Secondary | ICD-10-CM

## 2023-11-10 NOTE — Therapy (Signed)
 OUTPATIENT PHYSICAL THERAPY THORACOLUMBAR TREATMENT   Patient Name: Helen Paul MRN: [id number] DOB:March 23, 1976, 48 y.o., female Today's Date: 11/10/2023  END OF SESSION:  PT End of Session - 11/10/23 0817     Visit Number 5    Number of Visits 6    Date for PT Re-Evaluation 12/08/23    Authorization Type BCBS    Authorization - Visit Number 5    Authorization - Number of Visits 6    PT Start Time 0807    PT Stop Time 0847    PT Time Calculation (min) 40 min    Activity Tolerance Patient tolerated treatment well    Behavior During Therapy [facility] for tasks assessed/performed              Past Medical History:  Diagnosis Date   Anxiety    Avascular necrosis of hip (HCC)    Depression    Hip pain, bilateral    Hyperlipidemia    Migraines    Sleep apnea    Past Surgical History:  Procedure Laterality Date   JOINT REPLACEMENT  03-18-11   left total hip, per Dr. Rosemary Conrad at [facility] GASTROPLASTY  01-13-14   per Dr. Annitta Kindler at [facility] TOOTH EXTRACTION     Patient Active Problem List   Diagnosis Date Noted   Back pain 08/21/2023   Neck pain 12/27/2021   Large breasts 12/27/2021   ADHD (attention deficit hyperactivity disorder), inattentive type 11/27/2021   Depression with anxiety 02/14/2019   Insomnia 01/02/2015   Morbid obesity (HCC) 01/20/2013   OSA (obstructive sleep apnea) 02/21/2011    PCP: Corita Diego MD   REFERRING PROVIDER: Gilles Lacks DO   REFERRING DIAG:  [phone] (ICD-10-CM) - Chronic bilateral thoracic back pain  M54.2 (ICD-10-CM) - Neck pain  N62 (ICD-10-CM) - Large breasts    Rationale for Evaluation and Treatment: Rehabilitation  THERAPY DIAG:  Abnormal posture  Cervicalgia  ONSET DATE: chronic   SUBJECTIVE:                                                                                                                                                                                            SUBJECTIVE STATEMENT: Pt states doing a bit better overall. Trying to pay attention to her posture.   Eval: Patient is here for neck and upper back pain due to mammary hypertrophy.  She is unable to participate in recreation activities due to discomfort and pain.  She cannot be as active as she wants to be.  She is unable to sleep on her back, pain and pressure,  and in sidelying as well.  She has had skin irritation as well.  She has increase tension in upper back.  She sometimes has numbness/tingling in her hands when she wakes up, more recently.   PERTINENT HISTORY:  Hip replacemment (48 y/o)   PAIN:  Are you having pain? Yes: NPRS scale: 5/10 can be as high as 8/10 Pain location: neck and upper back  Pain description: tight and tense  Aggravating factors: sitting , malposition for long time   Relieving factors: massage gun, heating pad, OTC meds   PRECAUTIONS: Other: none   RED FLAGS: None   WEIGHT BEARING RESTRICTIONS: No  FALLS:  Has patient fallen in last 6 months? No  LIVING ENVIRONMENT: Lives with: lives with their spouse Lives in: House/apartment Stairs: none Has following equipment at home:  none   OCCUPATION: Full time at [employer]   PLOF: Independent  PATIENT GOALS: Breast reduction surgery  NEXT MD VISIT: after PT   OBJECTIVE:  Note: Objective measures were completed at Evaluation unless otherwise noted.  DIAGNOSTIC FINDINGS:  None   PATIENT SURVEYS:  NDI 12/50  COGNITION: Overall cognitive status: Within functional limits for tasks assessed     SENSATION: Normal but does have numbness in fingers at times with malposition    POSTURE: rounded shoulders, forward head, and decreased lumbar lordosis GH joint internally rotated bilateral   PALPATION: Tension and trigger points in upper traps, levator scap  LUMBAR ROM:  WFL stiff in rotation  AROM eval  Flexion   Extension   Right lateral flexion   Left lateral flexion   Right  rotation   Left rotation    (Blank rows = not tested)  UPPER EXTREMITY ROM:   WNL    CERVICAL ROM:   Active ROM A/PROM (deg) 09/16/2023  Flexion 50  Extension 55  Right lateral flexion 45  Left lateral flexion 45  Right rotation WNL  Left rotation WNL   (Blank rows = not tested)  UE ROM:WNL  Decreased postural endurance   UE MMT Right 09/16/2023 Left 09/16/2023  Shoulder flexion 4 4  Shoulder extension    Shoulder abduction 4 4  Shoulder adduction    Shoulder extension    Shoulder internal rotation    Shoulder external rotation    Middle trapezius    Lower trapezius    Elbow flexion    Elbow extension    Wrist flexion    Wrist extension    Wrist ulnar deviation    Wrist radial deviation    Wrist pronation    Wrist supination    Grip strength     (Blank rows = not tested)    GAIT: Distance walked: WNL  Assistive device utilized: None Level of assistance:  NONE Comments: I   TREATMENT DATE:  [facility] Adult PT Treatment:                                               11/10/2023 Therapeutic Exercise: Aerobic: Supine:  SLR(review for HEP)  Bridging x 10;  Quadruped: sa press with chin tuck 2 x 10 Plank on knees x 2, toes x 2 (elbows) 10 sec  Seated:   chin tucks x 10;  Standing:    Stretches:  doorway stretch varied positions:  20 sec x 3;  Neuromuscular Re-education:  wall push ups 2 x 10;  Rows:  BlueTB 2 x 10 ,   Scap pull apart/abd GTB 2 x 8;    Bil ER RTB 1 x 10, GTB 1 x 10 ; For strength and education on body position/back/neck/shoulders with all  Manual Therapy:  Therapeutic Activity:  Self Care:   Therapeutic Exercise: Aerobic: Supine:  Seated:  cervical rotation x 10;   UT and levator stretch 20 sec x 3 bil;  chin tucks x 10;  Standing:    Stretches:   corner stretch 20 sec x 3;  Neuromuscular Re-education: wall push ups 2 x 10;  Rows: GTB 2 x 10 , low row Rtb 2 x10;  Scap pull apart/abd GTB 2 x 8;   Bil ER RTB 2 x 10; For strength and  education on body position/back/neck/shoulders with all  Manual Therapy:  Therapeutic Activity:  Self Care:    Eval: Therapeutic Activity: Used wall for posture Horizontal abduction green band x 10  ER x 10  Row  x 10  Extension x 10  Corner stretch 30 sec x 3 Self Care: POC, financial issues with BCBS plan               Posture, tennis ball                                                                                                                      PATIENT EDUCATION:  Education details: reviewed HEP, posture, PT course,  Person educated: Patient Education method: Explanation Education comprehension: verbalized understanding, returned demonstration, and needs further education  HOME EXERCISE PROGRAM: Access Code: [email or web address]  ASSESSMENT:  CLINICAL IMPRESSION: 11/10/2023  Pt challenged with strength progressions today, In quadruped and with plank. Will benefit from continued education on TA contraction next visit. She is progressing well with band exercises. Pt still having neck tightness and tension daily. Will continue education on strengthening next visit.   Eval: Patient is a 48 y.o. female who was seen today for physical therapy evaluation and treatment for upper back and neck pain due to symptomatic mammary hypertrophy.   OBJECTIVE IMPAIRMENTS: decreased activity tolerance, decreased strength, increased fascial restrictions, impaired UE functional use, postural dysfunction, obesity, and pain.   ACTIVITY LIMITATIONS: carrying, lifting, bending, sitting, standing, bed mobility, and locomotion level  PARTICIPATION LIMITATIONS: interpersonal relationship, community activity, and occupation  PERSONAL FACTORS: Time since onset of injury/illness/exacerbation and 1 comorbidity: hip replacement   are also affecting patient's functional outcome.   REHAB POTENTIAL: Excellent  CLINICAL DECISION MAKING: Evolving/moderate complexity  EVALUATION COMPLEXITY:  Moderate   GOALS:   LONG TERM GOALS: Target date: 10/28/2023    Pt will be I with HEP for posture and core Baseline:  Goal status: MET  2.  Pt will be able to show postural correction and maintain for upper body exercise for > 15 reps  Baseline:  Goal status: MET  3.  Pt will be able to make accomodation for work area (desk, screen and chair) to optimize work performance.  Baseline:  Goal status:  MET  4.  Pt will score NDI 19/50 as a proxy for improved neck and UE function.  Baseline: 12/50 Goal status: INITIAL    PT FREQUENCY: 1x/week  PT DURATION: 6 weeks  PLANNED INTERVENTIONS: 97164- PT Re-evaluation, 97110-Therapeutic exercises, 97530- Therapeutic activity, 97112- Neuromuscular re-education, 97535- Self Care, 16109- Manual therapy, Patient/Family education, Taping, Dry Needling, and Spinal mobilization.  PLAN FOR NEXT SESSION:  review TA- ta with SLR or march,  review HEP, likely d/c   Terrilee Few, PT, DPT 8:10 PM  11/10/23

## 2023-11-11 ENCOUNTER — Encounter: Payer: Self-pay | Admitting: Family Medicine

## 2023-11-18 ENCOUNTER — Encounter: Payer: Self-pay | Admitting: Physical Therapy

## 2023-11-18 ENCOUNTER — Ambulatory Visit: Admitting: Physical Therapy

## 2023-11-18 DIAGNOSIS — R293 Abnormal posture: Secondary | ICD-10-CM | POA: Diagnosis not present

## 2023-11-18 DIAGNOSIS — M542 Cervicalgia: Secondary | ICD-10-CM | POA: Diagnosis not present

## 2023-11-18 NOTE — Therapy (Signed)
 OUTPATIENT PHYSICAL THERAPY THORACOLUMBAR TREATMENT/ D/C    Patient Name: Helen Paul MRN: [id number] DOB:04-08-1976, 48 y.o., female Today's Date: 11/18/2023  END OF SESSION:  PT End of Session - 11/18/23 0802     Visit Number 6    Number of Visits 6    Date for PT Re-Evaluation 12/08/23    Authorization Type BCBS    Authorization - Visit Number 6    Authorization - Number of Visits 6    PT Start Time 0804    PT Stop Time 0845    PT Time Calculation (min) 41 min    Activity Tolerance Patient tolerated treatment well    Behavior During Therapy [facility] for tasks assessed/performed              Past Medical History:  Diagnosis Date   Anxiety    Avascular necrosis of hip (HCC)    Depression    Hip pain, bilateral    Hyperlipidemia    Migraines    Sleep apnea    Past Surgical History:  Procedure Laterality Date   JOINT REPLACEMENT  03-18-11   left total hip, per Dr. Rosemary Conrad at [facility] GASTROPLASTY  01-13-14   per Dr. Annitta Kindler at [facility] TOOTH EXTRACTION     Patient Active Problem List   Diagnosis Date Noted   Back pain 08/21/2023   Neck pain 12/27/2021   Large breasts 12/27/2021   ADHD (attention deficit hyperactivity disorder), inattentive type 11/27/2021   Depression with anxiety 02/14/2019   Insomnia 01/02/2015   Morbid obesity (HCC) 01/20/2013   OSA (obstructive sleep apnea) 02/21/2011    PCP: Corita Diego MD   REFERRING PROVIDER: Gilles Lacks DO   REFERRING DIAG:  [phone] (ICD-10-CM) - Chronic bilateral thoracic back pain  M54.2 (ICD-10-CM) - Neck pain  N62 (ICD-10-CM) - Large breasts    Rationale for Evaluation and Treatment: Rehabilitation  THERAPY DIAG:  Abnormal posture  Cervicalgia  ONSET DATE: chronic   SUBJECTIVE:                                                                                                                                                                                            SUBJECTIVE STATEMENT: Pt states doing a bit better overall and is Trying to pay attention to her posture. Has been doing HEP, but finds it hard to find the time.   Eval: Patient is here for neck and upper back pain due to mammary hypertrophy.  She is unable to participate in recreation activities due to discomfort and pain.  She cannot be as active as  she wants to be.  She is unable to sleep on her back, pain and pressure, and in sidelying as well.  She has had skin irritation as well.  She has increase tension in upper back.  She sometimes has numbness/tingling in her hands when she wakes up, more recently.   PERTINENT HISTORY:  Hip replacemment (48 y/o)   PAIN:  Are you having pain? Yes: NPRS scale: 3/10 can be as high as 5/10 Pain location: neck and upper back  Pain description: tight and tense  Aggravating factors: sitting , malposition for long time   Relieving factors: massage gun, heating pad, OTC meds   PRECAUTIONS: Other: none   RED FLAGS: None   WEIGHT BEARING RESTRICTIONS: No  FALLS:  Has patient fallen in last 6 months? No  LIVING ENVIRONMENT: Lives with: lives with their spouse Lives in: House/apartment Stairs: none Has following equipment at home: none   OCCUPATION: Full time at [employer]   PLOF: Independent  PATIENT GOALS: Breast reduction surgery  NEXT MD VISIT: after PT   OBJECTIVE:    DIAGNOSTIC FINDINGS:  None   PATIENT SURVEYS:  Eval: NDI 12/50 D/C:   NDI:    5/50   COGNITION: Overall cognitive status: Within functional limits for tasks assessed     SENSATION: Normal but does have numbness in fingers at times with malposition    POSTURE: rounded shoulders, forward head, and decreased lumbar lordosis GH joint internally rotated bilateral   PALPATION: Tension and trigger points in upper traps, levator scap  LUMBAR ROM:  WFL  AROM eval  Flexion   Extension   Right lateral flexion   Left lateral flexion   Right rotation    Left rotation    (Blank rows = not tested)  UPPER EXTREMITY ROM:   WNL    CERVICAL ROM:  D/c: wfl  Active ROM A/PROM (deg) 09/16/2023  Flexion 50  Extension 55  Right lateral flexion 45  Left lateral flexion 45  Right rotation WNL  Left rotation WNL   (Blank rows = not tested)  UE ROM:WNL  Decreased postural endurance   UE MMT Right 11/18/2023 Left 11/18/2023  Shoulder flexion 4+ 4+  Shoulder extension    Shoulder abduction 4+ 4+  Shoulder adduction    Shoulder extension    Shoulder internal rotation    Shoulder external rotation    Middle trapezius    Lower trapezius    Elbow flexion    Elbow extension    Wrist flexion    Wrist extension    Wrist ulnar deviation    Wrist radial deviation    Wrist pronation    Wrist supination    Grip strength     (Blank rows = not tested)    GAIT: Distance walked: WNL  Assistive device utilized: None Level of assistance: NONE Comments: I    TREATMENT DATE:  [facility] Adult PT Treatment:                                               11/18/2023 Therapeutic Exercise: Aerobic: Supine:  SLRwith TA, x 15 bil;  March with TA x 20;  TA with education on optimal contraction x 15; Bridging 2 x 10;  Plank  (elbows) 3 x 10 sec  Seated:  Standing:    Stretches:  doorway stretch varied positions:  20 sec x  3;  Neuromuscular Re-education: standing at wall, eduction and practice on head/neck posture being neutral over trunk   wall push ups 2 x 10;  Rows: BlueTB 2 x 10 ,  Low row GTB x 15;  Scap pull apart/abd GTB 2 x 8;    For strength and education on body position/back/neck/shoulders with all  Manual Therapy:  Therapeutic Activity:  Self Care:                                                                          PATIENT EDUCATION:  Education details: reviewed HEP, posture, Person educated: Patient Education method: Explanation Education comprehension: verbalized understanding, returned demonstration, and needs further  education  HOME EXERCISE PROGRAM: Access Code: [email or web address]  ASSESSMENT:  CLINICAL IMPRESSION: 11/18/2023  Pt has made good improvements in postural awareness. She continues to be challenged with higher level strengthening, and will benefit from continued work on strength of postural muscles and core with HEP. She has good understanding of HEP at this time. Final HEP reviewed in detail today, as well as continued education on posture for head and neck over trunk. Posture and strength should continue to help tightness and soreness in neck with activity. She has met goals at this time, and is ready for d/c to HEP, completing 6 visits. Pt to f/u with MD in next couple weeks.   Eval: Patient is a 48 y.o. female who was seen today for physical therapy evaluation and treatment for upper back and neck pain due to symptomatic mammary hypertrophy.   OBJECTIVE IMPAIRMENTS: decreased activity tolerance, decreased strength, increased fascial restrictions, impaired UE functional use, postural dysfunction, obesity, and pain.   ACTIVITY LIMITATIONS: carrying, lifting, bending, sitting, standing, bed mobility, and locomotion level  PARTICIPATION LIMITATIONS: interpersonal relationship, community activity, and occupation  PERSONAL FACTORS: Time since onset of injury/illness/exacerbation and 1 comorbidity: hip replacement  are also affecting patient's functional outcome.   REHAB POTENTIAL: Excellent  CLINICAL DECISION MAKING: Evolving/moderate complexity  EVALUATION COMPLEXITY: Moderate   GOALS:   LONG TERM GOALS: Target date: 10/28/2023    Pt will be I with HEP for posture and core Baseline:  Goal status: MET  2.  Pt will be able to show postural correction and maintain for upper body exercise for > 15 reps  Baseline:  Goal status: MET  3.  Pt will be able to make accomodation for work area (desk, screen and chair) to optimize work performance.  Baseline:  Goal status: MET  4.  Pt will score  NDI 19/50 as a proxy for improved neck and UE function.  Baseline: 12/50 Goal status: MET    PT FREQUENCY: 1x/week  PT DURATION: 6 weeks  PLANNED INTERVENTIONS: 97164- PT Re-evaluation, 97110-Therapeutic exercises, 97530- Therapeutic activity, 97112- Neuromuscular re-education, 97535- Self Care, 54098- Manual therapy, Patient/Family education, Taping, Dry Needling, and Spinal mobilization.  PLAN FOR NEXT SESSION:    Terrilee Few, PT, DPT 8:02 AM  11/18/23  PHYSICAL THERAPY DISCHARGE SUMMARY  Visits from Start of Care: 6   Plan: Patient agrees to discharge.  Patient goals were met. Patient is being discharged due to meeting the stated rehab goals.     Terrilee Few, PT, DPT 10:31 AM  11/18/23

## 2023-11-20 ENCOUNTER — Ambulatory Visit: Admitting: Student

## 2023-11-20 ENCOUNTER — Encounter: Payer: Self-pay | Admitting: Student

## 2023-11-20 VITALS — BP 129/82 | HR 87 | Ht 65.0 in | Wt 219.6 lb

## 2023-11-20 DIAGNOSIS — N62 Hypertrophy of breast: Secondary | ICD-10-CM | POA: Diagnosis not present

## 2023-11-20 DIAGNOSIS — M546 Pain in thoracic spine: Secondary | ICD-10-CM | POA: Diagnosis not present

## 2023-11-20 DIAGNOSIS — G8929 Other chronic pain: Secondary | ICD-10-CM

## 2023-11-20 NOTE — Progress Notes (Signed)
   Referring Provider Donley Furth, MD [address] [place],  [place] [address]   CC:  Chief Complaint  Patient presents with   Follow-up      Helen Paul is an 48 y.o. female.  HPI: Patient is a 48 y.o. year old female here for follow up after completing physical therapy for pain related to macromastia.   She was seen for initial consult by Dr. Orin Birk on 08/21/2023.  At that time, patient complained of back and neck pain due to her enlarged breast.  On exam, her STN on the right was 36 cm and her STN on the left was 35 cm.  Her BMI was 37.9 kg/m.  Her preoperative bra size was J cup.  The estimated amount of excess breast tissue to be removed at the time of surgery was 750 g bilaterally.  Patient had a mammogram in 2025 which was negative.  The patient reported that she had an A1c done in July 2024 and it was 5.9.  The patient was found to be a good candidate for bilateral breast reduction with liposuction.  Patient was also referred to physical therapy.  Today, patient reports she is doing well.  She states that she completed 6 sessions of physical therapy.  She states that although some of the stretches are helpful, she is still experiencing back and neck pain.  She also states that she has difficult finding clothes that fit her well.  She states that she would still like to move forward with breast reduction.  She states that she has been working on losing weight.   Review of Systems General: Does not report any changes in her health. MSK: Endorses ongoing back and neck discomfort Skin: Reports intermittent rashes  Physical Exam    11/20/2023   10:52 AM 08/21/2023    8:10 AM 01/19/2023    9:02 AM  Vitals with BMI  Height 5\' 5"  5\' 5"  5\' 5"   Weight 219 lbs 10 oz 221 lbs 13 oz 228 lbs  BMI 36.54 36.91 37.94  Systolic 129 104 604  Diastolic 82 69 70  Pulse 87 77 72    General:  No acute distress,  Alert and oriented, Non-Toxic, Normal speech and affect Psych:  Normal behavior and mood Respiratory: No increased WOB MSK: Ambulatory  Assessment/Plan  Patient is interested in pursuing surgical intervention for bilateral breast reduction. Patient has completed at least 6 weeks of physical therapy for pain related to macromastia.  Discussed with patient we would submit to insurance for authorization, discussed approval could take up to 6 weeks.    [name] 11/20/2023, 11:36 AM

## 2023-12-02 ENCOUNTER — Encounter: Payer: Self-pay | Admitting: Family Medicine

## 2023-12-02 NOTE — Telephone Encounter (Signed)
 She should push our appt back another 2 weeks

## 2024-01-04 ENCOUNTER — Telehealth: Payer: Self-pay | Admitting: Plastic Surgery

## 2024-01-04 NOTE — Telephone Encounter (Signed)
 Patient would like to know if you could place an order for her at [facility], pleas ereach out and advise? The appt with [facility] is tomorrow

## 2024-01-05 NOTE — Telephone Encounter (Signed)
 Called and spoke with the patient regarding the message below.  Informed her of message below from [facility].  Per Matthew:We do not provide prescriptions for [facility] for breast reduction patients, unless related to a cancer diagnoses due to insurance requirements.   Patient verbalized understanding and stated that she spoke with [facility] and they informed her that she will need an order from us .  Informed her that we have been informed by [facility] that supplies are not covered for breast reduction surgery, but for breast cancer patients only.    Informed the patient that she can give [facility] a call back and see what they say.  Patient verbalized understanding and agreed.//AB./CMA

## 2024-01-06 ENCOUNTER — Telehealth: Payer: Self-pay

## 2024-01-06 ENCOUNTER — Ambulatory Visit (INDEPENDENT_AMBULATORY_CARE_PROVIDER_SITE_OTHER): Admitting: Student

## 2024-01-06 VITALS — BP 119/80 | HR 83 | Wt 219.0 lb

## 2024-01-06 DIAGNOSIS — G8929 Other chronic pain: Secondary | ICD-10-CM

## 2024-01-06 DIAGNOSIS — M546 Pain in thoracic spine: Secondary | ICD-10-CM

## 2024-01-06 MED ORDER — OXYCODONE HCL 5 MG PO TABS: 5.0000 mg | ORAL_TABLET | Freq: Four times a day (QID) | ORAL | 0 refills | Status: DC | PRN

## 2024-01-06 MED ORDER — ONDANSETRON HCL 4 MG PO TABS: 4.0000 mg | ORAL_TABLET | Freq: Three times a day (TID) | ORAL | 0 refills | Status: DC | PRN

## 2024-01-06 MED ORDER — CEPHALEXIN 500 MG PO CAPS: 500.0000 mg | ORAL_CAPSULE | Freq: Four times a day (QID) | ORAL | 0 refills | Status: AC

## 2024-01-06 NOTE — Progress Notes (Signed)
 Patient ID: Helen Paul, female    DOB: 07-Apr-1976, 48 y.o.   MRN: [id number]  Chief Complaint  Patient presents with   Pre-op Exam      ICD-10-CM   1. Chronic bilateral thoracic back pain  M54.6    G89.29        History of Present Illness: Helen Paul is a 48 y.o.  female  with a history of macromastia.  She presents for preoperative evaluation for upcoming procedure, Bilateral Breast Reduction with liposuction and removal of tissue, scheduled for 01/25/2024 with Dr.  Lowery  The patient states that she has had anesthesia before and states that she has had swollen lips postoperatively.  She also states that she metabolizes medications quickly.  She otherwise denies any other issues with anesthesia.  Patient denies any personal family history of breast cancer.  She denies any history of cardiac disease.  She denies taking any blood thinners.  Patient reports she is not a smoker.  Patient reports she currently has a Mirena  IUD in place.  She denies any history of miscarriages.  She denies any personal or family history of blood clots or clotting diseases.  She denies any recent surgeries, traumas or infections.  She denies any history of stroke or heart attack.  She denies any history of Crohn's disease or ulcerative colitis.  She denies any history of COPD or asthma.  She denies any history of cancer.  She denies any varicosities to her lower extremities.  She denies any recent fevers, chills or changes in her health.  Patient reports she is currently a 48J cup.  Patient states that she would like to be a C cup/on the smaller side.  Discussed with patient that cup size cannot be guaranteed.  Patient expressed understanding.  Patient today states that she is worried that she is going to have excess axillary tissue and would like this addressed.  I recommended that she further speak with Dr. Lowery in regards to this.  I discussed with her I will also send Dr. Lowery a  note in regards to this.  Patient expressed understanding.  Summary of Previous Visit: She was seen for initial consult by Dr. Lowery on 08/21/2023.  At that time, patient complained of back and neck pain due to her enlarged breast.  On exam, her STN on the right was 36 cm and her STN on the left was 35 cm.  Her BMI was 37.9 kg/m.  Her preoperative bra size was J cup.  The estimated amount of excess breast tissue to be removed at the time of surgery was 750 g bilaterally.  Patient had a mammogram in 2025 which was negative.  The patient reported that she had an A1c done in July 2024 and it was 5.9.  The patient was found to be a good candidate for bilateral breast reduction with liposuction.  Patient was also referred to physical therapy.   Patient was then seen again on 11/20/2023.  At this visit, patient was still experiencing back and neck pain after physical therapy and wanted to move forward with breast reduction.  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  Patient expressed understanding.   Past Medical History: Allergies:  Allergies  Allergen Reactions   Hydrocodone Nausea Only   Other Rash    Z-PACK. Z-PACK   Minocycline Other (See Comments)    Other reaction(s): Other Caused pressure in her head Caused pressure in her head    Azithromycin Rash   Hydrocodone-Acetaminophen  Nausea Only   Zithromax [Azithromycin Dihydrate] Rash    Current Medications:  Current Outpatient Medications:    ALPRAZolam  (XANAX ) 0.5 MG tablet, Take 1 tablet (0.5 mg total) by mouth 2 (two) times daily as needed for anxiety or sleep., Disp: 60 tablet, Rfl: 5   Cetirizine HCl (KLS ALLER-TEC PO), Take by mouth as needed., Disp: , Rfl:     lisdexamfetamine (VYVANSE) 40 MG capsule, Take 1 tablet by mouth daily., Disp: , Rfl:    levonorgestrel  (MIRENA , 52 MG,) 20 MCG/24HR IUD, 1 Intra Uterine Device (1 each total) by Intrauterine route once. (Patient not taking: Reported on 01/06/2024), Disp: 1 each, Rfl: 0  Past Medical Problems: Past Medical History:  Diagnosis Date   Anxiety    Avascular necrosis of hip (HCC)    Depression    Hip pain, bilateral    Hyperlipidemia    Migraines    Sleep apnea     Past Surgical History: Past Surgical History:  Procedure Laterality Date   JOINT REPLACEMENT  03-18-11   left total hip, per Dr. Jayson Crome at [facility] GASTROPLASTY  01-13-14   per Dr. Winfred at [facility] TOOTH EXTRACTION      Social History: Social History   Socioeconomic History   Marital status: Married    Spouse name: Not on file   Number of children: Not on file   Years of education: Not on file   Highest education level: Not on file  Occupational History   Not on file  Tobacco Use   Smoking status: Never   Smokeless tobacco: Never  Substance and Sexual Activity   Alcohol use: Yes    Alcohol/week: 0.0 standard drinks of alcohol    Comment: rare   Drug use: No   Sexual activity: Yes  Other Topics Concern   Not on file  Social History Narrative   Not on file   Social Drivers of Health   Financial Resource Strain: Not on file  Food Insecurity: Not on file  Transportation Needs: Not on file  Physical Activity: Not on file  Stress: Not on file  Social Connections: Not on file  Intimate Partner Violence: Not on file    Family History: Family History  Problem Relation Age of Onset   Fibromyalgia Mother    Chronic fatigue Mother    Heart disease Mother    Heart attack Mother    Heart disease Father    Hypertension Father    Diabetes Father    Heart failure Father    Narcolepsy Father    Heart attack Father    Obesity Sister    Obesity Brother     Review of  Systems: Denies any recent fevers, chills or changes in her health  Physical Exam: Vital Signs BP 119/80 (BP Location: Left Arm, Patient Position: Sitting, Cuff Size: Large)   Pulse 83   Wt 219 lb (99.3 kg)   SpO2 97%   BMI 36.44 kg/m   Physical Exam  Constitutional:      General: Not in acute distress.    Appearance: Normal appearance. Not ill-appearing.  HENT:     Head: Normocephalic and atraumatic.  Neck:  Musculoskeletal: Normal range of motion.  Cardiovascular:     Rate and Rhythm: Normal rate Pulmonary:     Effort: Pulmonary effort is normal. No respiratory distress.  Musculoskeletal: Normal range of motion.  Skin:    General: Skin is warm and dry.     Findings: No erythema or rash.  Neurological:     Mental Status: Alert and oriented to person, place, and time. Mental status is at baseline.  Psychiatric:        Mood and Affect: Mood normal.        Behavior: Behavior normal.    Assessment/Plan: The patient is scheduled for bilateral breast reduction with Dr. Lowery.  Risks, benefits, and alternatives of procedure discussed, questions answered and consent obtained.    Smoking Status: Non-smoker; Counseling Given?  N/A Last Mammogram: Per patient, patient had mammogram earlier this year in January.  She states it was negative.  Will send release of information for mammogram results.  Caprini Score: 4; Risk Factors include: Age, BMI > 25, and length of planned surgery. Recommendation for mechanical prophylaxis. Encourage early ambulation.   Pictures obtained: Updated photos were taken today  Post-op Rx sent to pharmacy: Oxycodone , Zofran , Keflex   Discussed with patient that she should hold her Vyvanse the day of surgery.  Also recommended that she hold NSAIDs and multivitamins/movements at least 1 week prior to surgery.  Also recommended that she not take Xanax  at the same time as pain medications.  Patient expressed understanding.  Patient was provided with  the breast reduction and General Surgical Risk consent document and Pain Medication Agreement prior to their appointment.  They had adequate time to read through the risk consent documents and Pain Medication Agreement. We also discussed them in person together during this preop appointment. All of their questions were answered to their satisfaction.  Recommended calling if they have any further questions.  Risk consent form and Pain Medication Agreement to be scanned into patient's chart.  The risk that can be encountered with breast reduction were discussed and include the following but not limited to these:  Breast asymmetry, fluid accumulation, firmness of the breast, inability to breast feed, loss of nipple or areola, skin loss, decrease or no nipple sensation, fat necrosis of the breast tissue, bleeding, infection, healing delay.  There are risks of anesthesia, changes to skin sensation and injury to nerves or blood vessels.  The muscle can be temporarily or permanently injured.  You may have an allergic reaction to tape, suture, glue, blood products which can result in skin discoloration, swelling, pain, skin lesions, poor healing.  Any of these can lead to the need for revisonal surgery or stage procedures.  A reduction has potential to interfere with diagnostic procedures.  Nipple or breast piercing can increase risks of infection.  This procedure is best done when the breast is fully developed.  Changes in the breast will continue to occur over time.  Pregnancy can alter the outcomes of previous breast reduction surgery, weight gain and weigh loss can also effect the long term appearance.     Electronically signed by: Estefana FORBES Peck, PA-C 01/06/2024 3:47 PM

## 2024-01-06 NOTE — Telephone Encounter (Signed)
 Faxed release of information to [facility], Dr. Marjorie Ross's office to request mammogram results per Estefana, [place] request. Received fax success confirmation. Forwarded to front desk for batch scanning.

## 2024-01-06 NOTE — H&P (View-Only) (Signed)
 Patient ID: Helen Paul, female    DOB: 07-Apr-1976, 48 y.o.   MRN: [id number]  Chief Complaint  Patient presents with   Pre-op Exam      ICD-10-CM   1. Chronic bilateral thoracic back pain  M54.6    G89.29        History of Present Illness: Helen Paul is a 48 y.o.  female  with a history of macromastia.  She presents for preoperative evaluation for upcoming procedure, Bilateral Breast Reduction with liposuction and removal of tissue, scheduled for 01/25/2024 with Dr.  Lowery  The patient states that she has had anesthesia before and states that she has had swollen lips postoperatively.  She also states that she metabolizes medications quickly.  She otherwise denies any other issues with anesthesia.  Patient denies any personal family history of breast cancer.  She denies any history of cardiac disease.  She denies taking any blood thinners.  Patient reports she is not a smoker.  Patient reports she currently has a Mirena  IUD in place.  She denies any history of miscarriages.  She denies any personal or family history of blood clots or clotting diseases.  She denies any recent surgeries, traumas or infections.  She denies any history of stroke or heart attack.  She denies any history of Crohn's disease or ulcerative colitis.  She denies any history of COPD or asthma.  She denies any history of cancer.  She denies any varicosities to her lower extremities.  She denies any recent fevers, chills or changes in her health.  Patient reports she is currently a 48J cup.  Patient states that she would like to be a C cup/on the smaller side.  Discussed with patient that cup size cannot be guaranteed.  Patient expressed understanding.  Patient today states that she is worried that she is going to have excess axillary tissue and would like this addressed.  I recommended that she further speak with Dr. Lowery in regards to this.  I discussed with her I will also send Dr. Lowery a  note in regards to this.  Patient expressed understanding.  Summary of Previous Visit: She was seen for initial consult by Dr. Lowery on 08/21/2023.  At that time, patient complained of back and neck pain due to her enlarged breast.  On exam, her STN on the right was 36 cm and her STN on the left was 35 cm.  Her BMI was 37.9 kg/m.  Her preoperative bra size was J cup.  The estimated amount of excess breast tissue to be removed at the time of surgery was 750 g bilaterally.  Patient had a mammogram in 2025 which was negative.  The patient reported that she had an A1c done in July 2024 and it was 5.9.  The patient was found to be a good candidate for bilateral breast reduction with liposuction.  Patient was also referred to physical therapy.   Patient was then seen again on 11/20/2023.  At this visit, patient was still experiencing back and neck pain after physical therapy and wanted to move forward with breast reduction.  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  Patient expressed understanding.   Past Medical History: Allergies:  Allergies  Allergen Reactions   Hydrocodone Nausea Only   Other Rash    Z-PACK. Z-PACK   Minocycline Other (See Comments)    Other reaction(s): Other Caused pressure in her head Caused pressure in her head    Azithromycin Rash   Hydrocodone-Acetaminophen  Nausea Only   Zithromax [Azithromycin Dihydrate] Rash    Current Medications:  Current Outpatient Medications:    ALPRAZolam  (XANAX ) 0.5 MG tablet, Take 1 tablet (0.5 mg total) by mouth 2 (two) times daily as needed for anxiety or sleep., Disp: 60 tablet, Rfl: 5   Cetirizine HCl (KLS ALLER-TEC PO), Take by mouth as needed., Disp: , Rfl:     lisdexamfetamine (VYVANSE) 40 MG capsule, Take 1 tablet by mouth daily., Disp: , Rfl:    levonorgestrel  (MIRENA , 52 MG,) 20 MCG/24HR IUD, 1 Intra Uterine Device (1 each total) by Intrauterine route once. (Patient not taking: Reported on 01/06/2024), Disp: 1 each, Rfl: 0  Past Medical Problems: Past Medical History:  Diagnosis Date   Anxiety    Avascular necrosis of hip (HCC)    Depression    Hip pain, bilateral    Hyperlipidemia    Migraines    Sleep apnea     Past Surgical History: Past Surgical History:  Procedure Laterality Date   JOINT REPLACEMENT  03-18-11   left total hip, per Dr. Jayson Crome at [facility] GASTROPLASTY  01-13-14   per Dr. Winfred at [address] TOOTH EXTRACTION      Social History: Social History   Socioeconomic History   Marital status: Married    Spouse name: Not on file   Number of children: Not on file   Years of education: Not on file   Highest education level: Not on file  Occupational History   Not on file  Tobacco Use   Smoking status: Never   Smokeless tobacco: Never  Substance and Sexual Activity   Alcohol use: Yes    Alcohol/week: 0.0 standard drinks of alcohol    Comment: rare   Drug use: No   Sexual activity: Yes  Other Topics Concern   Not on file  Social History Narrative   Not on file   Social Drivers of Health   Financial Resource Strain: Not on file  Food Insecurity: Not on file  Transportation Needs: Not on file  Physical Activity: Not on file  Stress: Not on file  Social Connections: Not on file  Intimate Partner Violence: Not on file    Family History: Family History  Problem Relation Age of Onset   Fibromyalgia Mother    Chronic fatigue Mother    Heart disease Mother    Heart attack Mother    Heart disease Father    Hypertension Father    Diabetes Father    Heart failure Father    Narcolepsy Father    Heart attack Father    Obesity Sister    Obesity Brother     Review of  Systems: Denies any recent fevers, chills or changes in her health  Physical Exam: Vital Signs BP 119/80 (BP Location: Left Arm, Patient Position: Sitting, Cuff Size: Large)   Pulse 83   Wt 219 lb (99.3 kg)   SpO2 97%   BMI 36.44 kg/m   Physical Exam  Constitutional:      General: Not in acute distress.    Appearance: Normal appearance. Not ill-appearing.  HENT:     Head: Normocephalic and atraumatic.  Neck:  Musculoskeletal: Normal range of motion.  Cardiovascular:     Rate and Rhythm: Normal rate Pulmonary:     Effort: Pulmonary effort is normal. No respiratory distress.  Musculoskeletal: Normal range of motion.  Skin:    General: Skin is warm and dry.     Findings: No erythema or rash.  Neurological:     Mental Status: Alert and oriented to person, place, and time. Mental status is at baseline.  Psychiatric:        Mood and Affect: Mood normal.        Behavior: Behavior normal.    Assessment/Plan: The patient is scheduled for bilateral breast reduction with Dr. Lowery.  Risks, benefits, and alternatives of procedure discussed, questions answered and consent obtained.    Smoking Status: Non-smoker; Counseling Given?  N/A Last Mammogram: Per patient, patient had mammogram earlier this year in January.  She states it was negative.  Will send release of information for mammogram results.  Caprini Score: 4; Risk Factors include: Age, BMI > 25, and length of planned surgery. Recommendation for mechanical prophylaxis. Encourage early ambulation.   Pictures obtained: Updated photos were taken today  Post-op Rx sent to pharmacy: Oxycodone , Zofran , Keflex   Discussed with patient that she should hold her Vyvanse the day of surgery.  Also recommended that she hold NSAIDs and multivitamins/movements at least 1 week prior to surgery.  Also recommended that she not take Xanax  at the same time as pain medications.  Patient expressed understanding.  Patient was provided with  the breast reduction and General Surgical Risk consent document and Pain Medication Agreement prior to their appointment.  They had adequate time to read through the risk consent documents and Pain Medication Agreement. We also discussed them in person together during this preop appointment. All of their questions were answered to their satisfaction.  Recommended calling if they have any further questions.  Risk consent form and Pain Medication Agreement to be scanned into patient's chart.  The risk that can be encountered with breast reduction were discussed and include the following but not limited to these:  Breast asymmetry, fluid accumulation, firmness of the breast, inability to breast feed, loss of nipple or areola, skin loss, decrease or no nipple sensation, fat necrosis of the breast tissue, bleeding, infection, healing delay.  There are risks of anesthesia, changes to skin sensation and injury to nerves or blood vessels.  The muscle can be temporarily or permanently injured.  You may have an allergic reaction to tape, suture, glue, blood products which can result in skin discoloration, swelling, pain, skin lesions, poor healing.  Any of these can lead to the need for revisonal surgery or stage procedures.  A reduction has potential to interfere with diagnostic procedures.  Nipple or breast piercing can increase risks of infection.  This procedure is best done when the breast is fully developed.  Changes in the breast will continue to occur over time.  Pregnancy can alter the outcomes of previous breast reduction surgery, weight gain and weigh loss can also effect the long term appearance.     Electronically signed by: Estefana FORBES Peck, PA-C 01/06/2024 3:47 PM

## 2024-01-07 ENCOUNTER — Telehealth: Payer: Self-pay

## 2024-01-07 NOTE — Telephone Encounter (Signed)
 Faxed referral to [facility] including demographics, ins card and most recent ov note. Received fax success confirmation and forwarded referral to front desk for batch scanning.

## 2024-01-07 NOTE — Telephone Encounter (Signed)
 Rx was sent yesterday to [facility] for this patient

## 2024-01-12 ENCOUNTER — Telehealth: Payer: Self-pay | Admitting: *Deleted

## 2024-01-12 NOTE — Telephone Encounter (Signed)
 Received on (01/05/2024) via of fax DME Standard Written Order from [facility].  Requesting signature and return.  Given to provider to sign.  DME Standard Written Order signed and faxed back to [facility].  Confirmation received and copy scanned into the chart.//AB/CMA

## 2024-01-14 ENCOUNTER — Encounter: Payer: Self-pay | Admitting: Plastic Surgery

## 2024-01-14 ENCOUNTER — Ambulatory Visit: Admitting: Plastic Surgery

## 2024-01-14 ENCOUNTER — Ambulatory Visit (HOSPITAL_BASED_OUTPATIENT_CLINIC_OR_DEPARTMENT_OTHER): Admit: 2024-01-14 | Admitting: Plastic Surgery

## 2024-01-14 ENCOUNTER — Encounter (HOSPITAL_BASED_OUTPATIENT_CLINIC_OR_DEPARTMENT_OTHER): Payer: Self-pay

## 2024-01-14 DIAGNOSIS — N62 Hypertrophy of breast: Secondary | ICD-10-CM | POA: Diagnosis not present

## 2024-01-14 SURGERY — BREAST REDUCTION WITH LIPOSUCTION
Anesthesia: Choice | Site: Breast | Laterality: Bilateral

## 2024-01-14 NOTE — H&P (View-Only) (Signed)
 Patient is going to move ahead with breast reduction but not focus on the extra lateral portion.  We will do this with insurance as a routine breast reduction with liposuction.  I connected with  KINNEDY MONGIELLO on 01/14/24 by phone and verified that I am speaking with the correct person using two identifiers. The patient was in [place] and I was at the hospital.  We spent 5 min in discussion.     I discussed the limitations of evaluation and management by telemedicine. The patient expressed understanding and agreed to proceed.

## 2024-01-14 NOTE — Progress Notes (Signed)
 Patient is going to move ahead with breast reduction but not focus on the extra lateral portion.  We will do this with insurance as a routine breast reduction with liposuction.  I connected with  Helen Paul on 01/14/24 by phone and verified that I am speaking with the correct person using two identifiers. The patient was in [place] and I was at the hospital.  We spent 5 min in discussion.     I discussed the limitations of evaluation and management by telemedicine. The patient expressed understanding and agreed to proceed.

## 2024-01-15 ENCOUNTER — Telehealth: Admitting: Plastic Surgery

## 2024-01-21 ENCOUNTER — Ambulatory Visit: Payer: Self-pay | Admitting: Family Medicine

## 2024-01-21 ENCOUNTER — Ambulatory Visit: Admitting: Family Medicine

## 2024-01-21 ENCOUNTER — Encounter: Payer: Self-pay | Admitting: Family Medicine

## 2024-01-21 VITALS — BP 110/72 | HR 61 | Temp 98.3°F | Ht 65.0 in | Wt 216.0 lb

## 2024-01-21 DIAGNOSIS — Z0000 Encounter for general adult medical examination without abnormal findings: Secondary | ICD-10-CM

## 2024-01-21 LAB — CBC WITH DIFFERENTIAL/PLATELET
Basophils Absolute: 0 K/uL (ref 0.0–0.1)
Basophils Relative: 0.5 % (ref 0.0–3.0)
Eosinophils Absolute: 0.1 K/uL (ref 0.0–0.7)
Eosinophils Relative: 1.4 % (ref 0.0–5.0)
HCT: 41.7 % (ref 36.0–46.0)
Hemoglobin: 13.8 g/dL (ref 12.0–15.0)
Lymphocytes Relative: 22.9 % (ref 12.0–46.0)
Lymphs Abs: 1.4 K/uL (ref 0.7–4.0)
MCHC: 33 g/dL (ref 30.0–36.0)
MCV: 88 fl (ref 78.0–100.0)
Monocytes Absolute: 0.3 K/uL (ref 0.1–1.0)
Monocytes Relative: 5.4 % (ref 3.0–12.0)
Neutro Abs: 4.4 K/uL (ref 1.4–7.7)
Neutrophils Relative %: 69.8 % (ref 43.0–77.0)
Platelets: 297 K/uL (ref 150.0–400.0)
RBC: 4.74 Mil/uL (ref 3.87–5.11)
RDW: 13.9 % (ref 11.5–15.5)
WBC: 6.3 K/uL (ref 4.0–10.5)

## 2024-01-21 LAB — HEPATIC FUNCTION PANEL
ALT: 19 U/L (ref 0–35)
AST: 20 U/L (ref 0–37)
Albumin: 4.4 g/dL (ref 3.5–5.2)
Alkaline Phosphatase: 71 U/L (ref 39–117)
Bilirubin, Direct: 0.2 mg/dL (ref 0.0–0.3)
Total Bilirubin: 0.6 mg/dL (ref 0.2–1.2)
Total Protein: 7.2 g/dL (ref 6.0–8.3)

## 2024-01-21 LAB — LIPID PANEL
Cholesterol: 192 mg/dL (ref 0–200)
HDL: 54.1 mg/dL (ref >39.00)
LDL Cholesterol: 119 mg/dL — ABNORMAL HIGH (ref 0–99)
NonHDL: 137.94
Total CHOL/HDL Ratio: 4
Triglycerides: 94 mg/dL (ref 0.0–149.0)
VLDL: 18.8 mg/dL (ref 0.0–40.0)

## 2024-01-21 LAB — BASIC METABOLIC PANEL WITH GFR
BUN: 19 mg/dL (ref 6–23)
CO2: 25 meq/L (ref 19–32)
Calcium: 9.6 mg/dL (ref 8.4–10.5)
Chloride: 103 meq/L (ref 96–112)
Creatinine, Ser: 0.85 mg/dL (ref 0.40–1.20)
GFR: 81.07 mL/min (ref >60.00)
Glucose, Bld: 98 mg/dL (ref 70–99)
Potassium: 3.8 meq/L (ref 3.5–5.1)
Sodium: 137 meq/L (ref 135–145)

## 2024-01-21 LAB — HEMOGLOBIN A1C: Hgb A1c MFr Bld: 5.9 % (ref 4.6–6.5)

## 2024-01-21 LAB — TSH: TSH: 1.82 u[IU]/mL (ref 0.35–5.50)

## 2024-01-21 MED ORDER — ALPRAZOLAM 0.5 MG PO TABS: 0.5000 mg | ORAL_TABLET | Freq: Two times a day (BID) | ORAL | 5 refills | Status: AC | PRN

## 2024-01-21 NOTE — Progress Notes (Signed)
 Subjective:    Patient ID: Helen Paul, female    DOB: 30-Jul-1975, 48 y.o.   MRN: [id number]  HPI Here for a well exam. She feels well. She is scheduled for breast reduction surgery next week with Dr. Lowery. She is interested in getting a colonoscopy because her grandmother had colon cancer. She also mentions some mild pain in the right hip that comes and goes lately. No hx of trauma. Of note, she had a left hip replacement due to avascular necrosis.    Review of Systems  Constitutional: Negative.   HENT: Negative.    Eyes: Negative.   Respiratory: Negative.    Cardiovascular: Negative.   Gastrointestinal: Negative.   Genitourinary:  Negative for decreased urine volume, difficulty urinating, dyspareunia, dysuria, enuresis, flank pain, frequency, hematuria, pelvic pain and urgency.  Musculoskeletal:  Positive for arthralgias.  Skin: Negative.   Neurological: Negative.  Negative for headaches.  Psychiatric/Behavioral: Negative.         Objective:   Physical Exam Constitutional:      General: She is not in acute distress.    Appearance: Normal appearance. She is well-developed.  HENT:     Head: Normocephalic and atraumatic.     Right Ear: External ear normal.     Left Ear: External ear normal.     Nose: Nose normal.     Mouth/Throat:     Pharynx: No oropharyngeal exudate.  Eyes:     General: No scleral icterus.    Conjunctiva/sclera: Conjunctivae normal.     Pupils: Pupils are equal, round, and reactive to light.  Neck:     Thyroid : No thyromegaly.     Vascular: No JVD.  Cardiovascular:     Rate and Rhythm: Normal rate and regular rhythm.     Pulses: Normal pulses.     Heart sounds: Normal heart sounds. No murmur heard.    No friction rub. No gallop.  Pulmonary:     Effort: Pulmonary effort is normal. No respiratory distress.     Breath sounds: Normal breath sounds. No wheezing or rales.  Chest:     Chest wall: No tenderness.  Abdominal:     General: Bowel  sounds are normal. There is no distension.     Palpations: Abdomen is soft. There is no mass.     Tenderness: There is no abdominal tenderness. There is no guarding or rebound.  Musculoskeletal:        General: No tenderness. Normal range of motion.     Cervical back: Normal range of motion and neck supple.  Lymphadenopathy:     Cervical: No cervical adenopathy.  Skin:    General: Skin is warm and dry.     Findings: No erythema or rash.  Neurological:     General: No focal deficit present.     Mental Status: She is alert and oriented to person, place, and time.     Cranial Nerves: No cranial nerve deficit.     Motor: No abnormal muscle tone.     Coordination: Coordination normal.     Deep Tendon Reflexes: Reflexes are normal and symmetric. Reflexes normal.  Psychiatric:        Mood and Affect: Mood normal.        Behavior: Behavior normal.        Thought Content: Thought content normal.        Judgment: Judgment normal.           Assessment & Plan:  Well exam. We  discussed diet and exercise. Get fasting labs. Set up a colonoscopy. I suggested she see Dr. Jayson Crome at [facility] about her hip pain since he performed the prior hip surgery.  Garnette Olmsted, MD

## 2024-01-22 ENCOUNTER — Encounter: Admitting: Student

## 2024-01-25 NOTE — Telephone Encounter (Signed)
 Good Morning,   Would one of you mind checking on this patient's FMLA paperwork status? I filled it out when she initially sent it.   Thank you very much,  Helen Paul

## 2024-01-26 NOTE — Telephone Encounter (Signed)
 Good Morning Alesha,   Would you mind helping this patient please?   Thank you, Estefana

## 2024-01-27 DIAGNOSIS — Z719 Counseling, unspecified: Secondary | ICD-10-CM

## 2024-01-28 ENCOUNTER — Other Ambulatory Visit: Payer: Self-pay

## 2024-01-28 ENCOUNTER — Encounter (HOSPITAL_BASED_OUTPATIENT_CLINIC_OR_DEPARTMENT_OTHER): Payer: Self-pay | Admitting: Plastic Surgery

## 2024-02-01 ENCOUNTER — Encounter: Admitting: Student

## 2024-02-03 NOTE — Anesthesia Preprocedure Evaluation (Signed)
 Anesthesia Evaluation  Patient identified by MRN, date of birth, ID band Patient awake    Reviewed: Allergy & Precautions, NPO status , Patient's Chart, lab work & pertinent test results  Airway Mallampati: II  TM Distance: >3 FB Neck ROM: Full    Dental no notable dental hx. (+) Teeth Intact, Dental Advisory Given   Pulmonary sleep apnea and Continuous Positive Airway Pressure Ventilation    Pulmonary exam normal breath sounds clear to auscultation       Cardiovascular (-) hypertension(-) angina (-) Past MI negative cardio ROS Normal cardiovascular exam Rhythm:Regular Rate:Normal     Neuro/Psych Headaches: hydrocodone, minocycline. PSYCHIATRIC DISORDERS Anxiety Depression       GI/Hepatic negative GI ROS, Neg liver ROS,,,  Endo/Other  negative endocrine ROS    Renal/GU Lab Results      Component                Value               Date                      NA                       137                 01/21/2024                CL                       103                 01/21/2024                K                        3.8                 01/21/2024                CO2                      25                  01/21/2024                BUN                      19                  01/21/2024                CREATININE               0.85                01/21/2024                GFR                      81.07               01/21/2024                CALCIUM                  9.6  01/21/2024                ALBUMIN                  4.4                 01/21/2024                GLUCOSE                  98                  01/21/2024                Musculoskeletal   Abdominal   Peds  Hematology Lab Results      Component                Value               Date                      WBC                      6.3                 01/21/2024                HGB                      13.8                01/21/2024                 HCT                      41.7                01/21/2024                MCV                      88.0                01/21/2024                PLT                      297.0               01/21/2024              Anesthesia Other Findings All: minocycline, hydrocodone  Reproductive/Obstetrics                              Anesthesia Physical Anesthesia Plan  ASA: 2  Anesthesia Plan: General   Post-op Pain Management: Precedex , Tylenol  PO (pre-op)*, Regional block* and Minimal or no pain anticipated   Induction: Intravenous  PONV Risk Score and Plan: 4 or greater and Propofol  infusion, TIVA, Midazolam , Treatment may vary due to age or medical condition, Ondansetron  and Dexamethasone   Airway Management Planned: Oral ETT and LMA  Additional Equipment: None  Intra-op Plan:   Post-operative Plan: Extubation in OR  Informed Consent: I have reviewed the patients History and Physical, chart, labs and discussed the procedure including the risks, benefits and alternatives for the proposed  anesthesia with the patient or authorized representative who has indicated his/her understanding and acceptance.     Dental advisory given  Plan Discussed with: CRNA and Surgeon  Anesthesia Plan Comments:          Anesthesia Quick Evaluation

## 2024-02-04 ENCOUNTER — Ambulatory Visit (HOSPITAL_BASED_OUTPATIENT_CLINIC_OR_DEPARTMENT_OTHER): Payer: Self-pay | Admitting: Anesthesiology

## 2024-02-04 ENCOUNTER — Other Ambulatory Visit: Payer: Self-pay

## 2024-02-04 ENCOUNTER — Encounter (HOSPITAL_BASED_OUTPATIENT_CLINIC_OR_DEPARTMENT_OTHER)
Admission: RE | Disposition: A | Discharge status: Home / Self Care | Payer: Self-pay | Source: Home / Self Care | Attending: Plastic Surgery

## 2024-02-04 ENCOUNTER — Ambulatory Visit (HOSPITAL_BASED_OUTPATIENT_CLINIC_OR_DEPARTMENT_OTHER)
Admission: RE | Admit: 2024-02-04 | Discharge: 2024-02-04 | Disposition: A | Discharge status: Home / Self Care | Attending: Plastic Surgery | Admitting: Plastic Surgery

## 2024-02-04 ENCOUNTER — Encounter (HOSPITAL_BASED_OUTPATIENT_CLINIC_OR_DEPARTMENT_OTHER): Payer: Self-pay | Admitting: Plastic Surgery

## 2024-02-04 DIAGNOSIS — F419 Anxiety disorder, unspecified: Secondary | ICD-10-CM | POA: Insufficient documentation

## 2024-02-04 DIAGNOSIS — N6082 Other benign mammary dysplasias of left breast: Secondary | ICD-10-CM | POA: Diagnosis not present

## 2024-02-04 DIAGNOSIS — M546 Pain in thoracic spine: Secondary | ICD-10-CM | POA: Diagnosis not present

## 2024-02-04 DIAGNOSIS — F32A Depression, unspecified: Secondary | ICD-10-CM | POA: Insufficient documentation

## 2024-02-04 DIAGNOSIS — M549 Dorsalgia, unspecified: Secondary | ICD-10-CM | POA: Diagnosis not present

## 2024-02-04 DIAGNOSIS — N62 Hypertrophy of breast: Secondary | ICD-10-CM | POA: Diagnosis present

## 2024-02-04 DIAGNOSIS — M542 Cervicalgia: Secondary | ICD-10-CM | POA: Insufficient documentation

## 2024-02-04 DIAGNOSIS — G4730 Sleep apnea, unspecified: Secondary | ICD-10-CM | POA: Insufficient documentation

## 2024-02-04 DIAGNOSIS — Z01818 Encounter for other preprocedural examination: Secondary | ICD-10-CM

## 2024-02-04 DIAGNOSIS — G4733 Obstructive sleep apnea (adult) (pediatric): Secondary | ICD-10-CM | POA: Diagnosis not present

## 2024-02-04 DIAGNOSIS — Z975 Presence of (intrauterine) contraceptive device: Secondary | ICD-10-CM | POA: Insufficient documentation

## 2024-02-04 DIAGNOSIS — G8929 Other chronic pain: Secondary | ICD-10-CM | POA: Insufficient documentation

## 2024-02-04 HISTORY — DX: Other complications of anesthesia, initial encounter: T88.59XA

## 2024-02-04 HISTORY — PX: BREAST REDUCTION SURGERY: SHX8

## 2024-02-04 LAB — POCT PREGNANCY, URINE: Preg Test, Ur: NEGATIVE

## 2024-02-04 SURGERY — BREAST REDUCTION WITH LIPOSUCTION
Anesthesia: General | Site: Breast | Laterality: Bilateral

## 2024-02-04 MED ORDER — PROPOFOL 10 MG/ML IV BOLUS
INTRAVENOUS | Status: AC
Start: 2024-02-04 — End: 2024-02-04
  Filled 2024-02-04: qty 20

## 2024-02-04 MED ORDER — NITROGLYCERIN 2 % TD OINT
TOPICAL_OINTMENT | TRANSDERMAL | Status: AC
  Filled 2024-02-04: qty 30

## 2024-02-04 MED ORDER — ONDANSETRON HCL 4 MG/2ML IJ SOLN: 4.0000 mg | Freq: Once | INTRAMUSCULAR | Status: DC | PRN

## 2024-02-04 MED ORDER — TRANEXAMIC ACID-NACL 1000-0.7 MG/100ML-% IV SOLN
INTRAVENOUS | Status: AC
  Filled 2024-02-04: qty 100

## 2024-02-04 MED ORDER — DEXMEDETOMIDINE HCL IN NACL 80 MCG/20ML IV SOLN
INTRAVENOUS | Status: DC | PRN
Start: 2024-02-04 — End: 2024-02-04
  Administered 2024-02-04: 8 ug via INTRAVENOUS

## 2024-02-04 MED ORDER — FENTANYL CITRATE (PF) 100 MCG/2ML IJ SOLN
INTRAMUSCULAR | Status: AC
Start: 2024-02-04 — End: 2024-02-04
  Filled 2024-02-04: qty 2

## 2024-02-04 MED ORDER — MIDAZOLAM HCL 5 MG/5ML IJ SOLN
INTRAMUSCULAR | Status: DC | PRN
  Administered 2024-02-04: 2 mg via INTRAVENOUS

## 2024-02-04 MED ORDER — OXYCODONE HCL 5 MG PO TABS: 5.0000 mg | ORAL_TABLET | ORAL | Status: DC | PRN

## 2024-02-04 MED ORDER — PROPOFOL 1000 MG/100ML IV EMUL
INTRAVENOUS | Status: AC
  Filled 2024-02-04: qty 100

## 2024-02-04 MED ORDER — DEXAMETHASONE SODIUM PHOSPHATE 10 MG/ML IJ SOLN
INTRAMUSCULAR | Status: DC | PRN
  Administered 2024-02-04: 10 mg via INTRAVENOUS

## 2024-02-04 MED ORDER — NITROGLYCERIN 2 % TD OINT
TOPICAL_OINTMENT | TRANSDERMAL | Status: DC | PRN
  Administered 2024-02-04: 1 [in_us] via TOPICAL

## 2024-02-04 MED ORDER — OXYCODONE HCL 5 MG PO TABS
5.0000 mg | ORAL_TABLET | Freq: Once | ORAL | Status: AC | PRN
  Administered 2024-02-04: 5 mg via ORAL

## 2024-02-04 MED ORDER — CEFAZOLIN SODIUM-DEXTROSE 2-4 GM/100ML-% IV SOLN
2.0000 g | INTRAVENOUS | Status: AC
  Administered 2024-02-04: 2 g via INTRAVENOUS

## 2024-02-04 MED ORDER — OXYCODONE HCL 5 MG/5ML PO SOLN: 5.0000 mg | Freq: Once | ORAL | Status: AC | PRN

## 2024-02-04 MED ORDER — FENTANYL CITRATE (PF) 100 MCG/2ML IJ SOLN
25.0000 ug | INTRAMUSCULAR | Status: DC | PRN
  Administered 2024-02-04: 50 ug via INTRAVENOUS

## 2024-02-04 MED ORDER — KETAMINE HCL 10 MG/ML IJ SOLN
INTRAMUSCULAR | Status: DC | PRN
  Administered 2024-02-04: 20 mg via INTRAVENOUS

## 2024-02-04 MED ORDER — CHLORHEXIDINE GLUCONATE CLOTH 2 % EX PADS: 6.0000 | MEDICATED_PAD | Freq: Once | CUTANEOUS | Status: DC

## 2024-02-04 MED ORDER — SODIUM CHLORIDE 0.9 % IV SOLN: 250.0000 mL | INTRAVENOUS | Status: DC | PRN

## 2024-02-04 MED ORDER — PROPOFOL 10 MG/ML IV BOLUS
INTRAVENOUS | Status: DC | PRN
Start: 2024-02-04 — End: 2024-02-04
  Administered 2024-02-04: 150 mg via INTRAVENOUS
  Administered 2024-02-04: 30 mg via INTRAVENOUS

## 2024-02-04 MED ORDER — MIDAZOLAM HCL 2 MG/2ML IJ SOLN
INTRAMUSCULAR | Status: AC
  Filled 2024-02-04: qty 2

## 2024-02-04 MED ORDER — ACETAMINOPHEN 10 MG/ML IV SOLN: 1000.0000 mg | Freq: Once | INTRAVENOUS | Status: DC | PRN

## 2024-02-04 MED ORDER — OXYCODONE HCL 5 MG PO TABS
ORAL_TABLET | ORAL | Status: AC
Start: 2024-02-04 — End: 2024-02-04
  Filled 2024-02-04: qty 1

## 2024-02-04 MED ORDER — BUPIVACAINE LIPOSOME 1.3 % IJ SUSP
INTRAMUSCULAR | Status: DC | PRN
  Administered 2024-02-04: 40 mL via INTRAMUSCULAR

## 2024-02-04 MED ORDER — BUPIVACAINE HCL 0.25 % IJ SOLN
INTRAMUSCULAR | Status: DC | PRN
  Administered 2024-02-04: 40 mL via INTRAMUSCULAR

## 2024-02-04 MED ORDER — PROPOFOL 500 MG/50ML IV EMUL
INTRAVENOUS | Status: DC | PRN
  Administered 2024-02-04: 150 ug/kg/min via INTRAVENOUS

## 2024-02-04 MED ORDER — ROCURONIUM BROMIDE 10 MG/ML (PF) SYRINGE
PREFILLED_SYRINGE | INTRAVENOUS | Status: AC
  Filled 2024-02-04: qty 10

## 2024-02-04 MED ORDER — ONDANSETRON HCL 4 MG/2ML IJ SOLN
INTRAMUSCULAR | Status: DC | PRN
  Administered 2024-02-04: 4 mg via INTRAVENOUS

## 2024-02-04 MED ORDER — SODIUM CHLORIDE 0.9% FLUSH
3.0000 mL | INTRAVENOUS | Status: DC | PRN
Start: 2024-02-04 — End: 2024-02-04

## 2024-02-04 MED ORDER — FENTANYL CITRATE (PF) 100 MCG/2ML IJ SOLN
INTRAMUSCULAR | Status: DC | PRN
  Administered 2024-02-04: 25 ug via INTRAVENOUS
  Administered 2024-02-04 (×2): 50 ug via INTRAVENOUS
  Administered 2024-02-04: 25 ug via INTRAVENOUS
  Administered 2024-02-04: 50 ug via INTRAVENOUS

## 2024-02-04 MED ORDER — VASHE WOUND IRRIGATION OPTIME
TOPICAL | Status: DC | PRN
  Administered 2024-02-04: 34 [oz_av] via TOPICAL

## 2024-02-04 MED ORDER — LACTATED RINGERS IV SOLN: INTRAVENOUS | Status: DC

## 2024-02-04 MED ORDER — LIDOCAINE HCL (CARDIAC) PF 100 MG/5ML IV SOSY
PREFILLED_SYRINGE | INTRAVENOUS | Status: DC | PRN
  Administered 2024-02-04: 100 mg via INTRAVENOUS

## 2024-02-04 MED ORDER — SODIUM CHLORIDE 0.9% FLUSH: 3.0000 mL | Freq: Two times a day (BID) | INTRAVENOUS | Status: DC

## 2024-02-04 MED ORDER — AMISULPRIDE (ANTIEMETIC) 5 MG/2ML IV SOLN: 10.0000 mg | Freq: Once | INTRAVENOUS | Status: DC | PRN

## 2024-02-04 MED ORDER — LIDOCAINE HCL 1 % IJ SOLN
INTRAVENOUS | Status: DC | PRN
  Administered 2024-02-04: 300 mL

## 2024-02-04 MED ORDER — ROCURONIUM BROMIDE 100 MG/10ML IV SOLN
INTRAVENOUS | Status: DC | PRN
  Administered 2024-02-04: 20 mg via INTRAVENOUS
  Administered 2024-02-04: 60 mg via INTRAVENOUS
  Administered 2024-02-04: 20 mg via INTRAVENOUS

## 2024-02-04 MED ORDER — TRANEXAMIC ACID-NACL 1000-0.7 MG/100ML-% IV SOLN
INTRAVENOUS | Status: DC | PRN
  Administered 2024-02-04: 1000 mg via INTRAVENOUS

## 2024-02-04 MED ORDER — SUGAMMADEX SODIUM 200 MG/2ML IV SOLN
INTRAVENOUS | Status: DC | PRN
  Administered 2024-02-04: 200 mg via INTRAVENOUS

## 2024-02-04 MED ORDER — ACETAMINOPHEN 325 MG PO TABS
650.0000 mg | ORAL_TABLET | ORAL | Status: DC | PRN
Start: 2024-02-04 — End: 2024-02-04

## 2024-02-04 MED ORDER — ONDANSETRON HCL 4 MG/2ML IJ SOLN
INTRAMUSCULAR | Status: AC
Start: 2024-02-04 — End: 2024-02-04
  Filled 2024-02-04: qty 2

## 2024-02-04 MED ORDER — LIDOCAINE 2% (20 MG/ML) 5 ML SYRINGE
INTRAMUSCULAR | Status: AC
  Filled 2024-02-04: qty 5

## 2024-02-04 MED ORDER — KETAMINE HCL 50 MG/5ML IJ SOSY
PREFILLED_SYRINGE | INTRAMUSCULAR | Status: AC
  Filled 2024-02-04: qty 5

## 2024-02-04 MED ORDER — ACETAMINOPHEN 325 MG RE SUPP: 650.0000 mg | RECTAL | Status: DC | PRN

## 2024-02-04 MED ORDER — CEFAZOLIN SODIUM-DEXTROSE 2-4 GM/100ML-% IV SOLN
INTRAVENOUS | Status: AC
  Filled 2024-02-04: qty 100

## 2024-02-04 MED ORDER — DEXAMETHASONE SODIUM PHOSPHATE 10 MG/ML IJ SOLN
INTRAMUSCULAR | Status: AC
  Filled 2024-02-04: qty 1

## 2024-02-04 SURGICAL SUPPLY — 65 items
BINDER BREAST LRG (GAUZE/BANDAGES/DRESSINGS) IMPLANT
BINDER BREAST MEDIUM (GAUZE/BANDAGES/DRESSINGS) IMPLANT
BINDER BREAST XLRG (GAUZE/BANDAGES/DRESSINGS) IMPLANT
BINDER BREAST XXLRG (GAUZE/BANDAGES/DRESSINGS) IMPLANT
BIOPATCH RED 1 DISK 7.0 (GAUZE/BANDAGES/DRESSINGS) IMPLANT
BLADE HEX COATED 2.75 (ELECTRODE) IMPLANT
BLADE KNIFE PERSONA 10 (BLADE) ×2 IMPLANT
BLADE SURG 15 STRL LF DISP TIS (BLADE) ×1 IMPLANT
CANISTER SUCT 1200ML W/VALVE (MISCELLANEOUS) ×1 IMPLANT
CLEANSER WND VASHE 34 (WOUND CARE) ×1 IMPLANT
COTTONBALL LRG STERILE PKG (GAUZE/BANDAGES/DRESSINGS) IMPLANT
COVER BACK TABLE 60X90IN (DRAPES) ×1 IMPLANT
COVER MAYO STAND STRL (DRAPES) ×1 IMPLANT
DERMABOND ADVANCED .7 DNX12 (GAUZE/BANDAGES/DRESSINGS) ×2 IMPLANT
DRAIN CHANNEL 15F RND FF W/TCR (WOUND CARE) IMPLANT
DRAIN CHANNEL 19F RND (DRAIN) IMPLANT
DRAPE LAPAROSCOPIC ABDOMINAL (DRAPES) ×1 IMPLANT
DRAPE UTILITY XL STRL (DRAPES) ×1 IMPLANT
DRSG MEPILEX POST OP 4X8 (GAUZE/BANDAGES/DRESSINGS) ×2 IMPLANT
DRSG TEGADERM 4X4.75 (GAUZE/BANDAGES/DRESSINGS) IMPLANT
DRSG TELFA 3X8 NADH STRL (GAUZE/BANDAGES/DRESSINGS) IMPLANT
ELECTRODE BLDE 4.0 EZ CLN MEGD (MISCELLANEOUS) ×1 IMPLANT
ELECTRODE REM PT RTRN 9FT ADLT (ELECTROSURGICAL) ×1 IMPLANT
EVACUATOR SILICONE 100CC (DRAIN) IMPLANT
GAUZE PAD ABD 8X10 STRL (GAUZE/BANDAGES/DRESSINGS) ×2 IMPLANT
GAUZE XEROFORM 5X9 LF (GAUZE/BANDAGES/DRESSINGS) IMPLANT
GLOVE BIO SURGEON STRL SZ 6.5 (GLOVE) ×2 IMPLANT
GLOVE BIO SURGEON STRL SZ7.5 (GLOVE) ×1 IMPLANT
GLOVE BIOGEL PI IND STRL 7.0 (GLOVE) IMPLANT
GLOVE BIOGEL PI IND STRL 8 (GLOVE) IMPLANT
GOWN STRL REUS W/ TWL LRG LVL3 (GOWN DISPOSABLE) ×2 IMPLANT
GOWN STRL REUS W/ TWL XL LVL3 (GOWN DISPOSABLE) IMPLANT
LINER CANISTER 1000CC FLEX (MISCELLANEOUS) ×1 IMPLANT
NDL FILTER BLUNT 18X1 1/2 (NEEDLE) IMPLANT
NDL HYPO 25X1 1.5 SAFETY (NEEDLE) ×2 IMPLANT
NEEDLE FILTER BLUNT 18X1 1/2 (NEEDLE) ×1 IMPLANT
NEEDLE HYPO 25X1 1.5 SAFETY (NEEDLE) ×2 IMPLANT
NS IRRIG 1000ML POUR BTL (IV SOLUTION) IMPLANT
PACK BASIN DAY SURGERY FS (CUSTOM PROCEDURE TRAY) ×1 IMPLANT
PAD ALCOHOL SWAB (MISCELLANEOUS) IMPLANT
PAD FOAM SILICONE BACKED (GAUZE/BANDAGES/DRESSINGS) IMPLANT
PENCIL SMOKE EVACUATOR (MISCELLANEOUS) ×1 IMPLANT
PIN SAFETY STERILE (MISCELLANEOUS) IMPLANT
POWDER MYRIAD MORCLLS FINE 500 (Miscellaneous) IMPLANT
SLEEVE SCD COMPRESS KNEE MED (STOCKING) ×1 IMPLANT
SPIKE FLUID TRANSFER (MISCELLANEOUS) IMPLANT
SPONGE T-LAP 18X18 ~~LOC~~+RFID (SPONGE) ×2 IMPLANT
STRIP SUTURE WOUND CLOSURE 1/2 (MISCELLANEOUS) ×4 IMPLANT
SUT MNCRL AB 4-0 PS2 18 (SUTURE) ×4 IMPLANT
SUT MON AB 3-0 SH27 (SUTURE) ×4 IMPLANT
SUT MON AB 5-0 PS2 18 (SUTURE) IMPLANT
SUT PDS II 3-0 CT2 27 ABS (SUTURE) ×4 IMPLANT
SUT SILK 3 0 PS 1 (SUTURE) IMPLANT
SUT VIC AB 4-0 PS2 27 (SUTURE) IMPLANT
SYR 50ML LL SCALE MARK (SYRINGE) IMPLANT
SYR BULB IRRIG 60ML STRL (SYRINGE) ×1 IMPLANT
SYR CONTROL 10ML LL (SYRINGE) ×2 IMPLANT
TAPE MEASURE VINYL STERILE (MISCELLANEOUS) IMPLANT
TOWEL GREEN STERILE FF (TOWEL DISPOSABLE) ×3 IMPLANT
TRAY DSU PREP LF (CUSTOM PROCEDURE TRAY) ×1 IMPLANT
TUBE CONNECTING 20X1/4 (TUBING) ×1 IMPLANT
TUBING INFILTRATION IT-10001 (TUBING) IMPLANT
TUBING SET GRADUATE ASPIR 12FT (MISCELLANEOUS) IMPLANT
UNDERPAD 30X36 HEAVY ABSORB (UNDERPADS AND DIAPERS) ×2 IMPLANT
YANKAUER SUCT BULB TIP NO VENT (SUCTIONS) ×1 IMPLANT

## 2024-02-04 NOTE — Op Note (Signed)
 Breast Reduction Op note:    DATE OF PROCEDURE: 02/04/2024  LOCATION: [facility]  SURGEON: Estefana Fritter, DO  ASSISTANT: Donnice Edelson, PA  PREOPERATIVE DIAGNOSIS 1. Macromastia 2. Neck Pain 3. Back Pain  POSTOPERATIVE DIAGNOSIS 1. Macromastia 2. Neck Pain 3. Back Pain  PROCEDURES 1. Bilateral breast reduction.  Right reduction 900 g, Left reduction 823 g  COMPLICATIONS: None.  DRAINS: none  INDICATIONS FOR PROCEDURE Helen Paul is a 48 y.o. year-old female born on 27-Nov-1975,with a history of symptomatic macromastia with concomitant back pain, neck pain, shoulder grooving from her bra.   MRN: [id number]  CONSENT Informed consent was obtained directly from the patient. The risks, benefits and alternatives were fully discussed. Specific risks including but not limited to bleeding, infection, hematoma, seroma, scarring, pain, nipple necrosis, asymmetry, poor cosmetic results, and need for further surgery were discussed. The patient's questions were answered.  DESCRIPTION OF PROCEDURE  Patient was brought into the operating room and rested on the operating room table in the supine position.  SCDs were placed and appropriate padding was performed.  Antibiotics were given. The patient underwent general anesthesia and the chest was prepped and draped in a sterile fashion.  A timeout was performed and all information was confirmed to be correct by those in the room. Tumescent was placed in the lateral breast.  Liposuction was done laterally on each side.  Right side: Preoperative markings were confirmed.  Incision lines were injected with local containing epinephrine .  After waiting for vasoconstriction, the marked lines were incised with a #15 blade.  A Wise-pattern superomedial breast reduction was performed by de-epithelializing the pedicle, using bovie to create the superomedial pedicle, and removing breast tissue from the lateral and inferior  portions of the breast.  Care was taken to not undermine the breast pedicle. Hemostasis was achieved.  Experel and myriad were placed in the pocket. The nipple was gently rotated into position and the soft tissue closed with 4-0 Monocryl.   The pocket was irrigated and hemostasis confirmed.  The deep tissues were approximated with 3-0 PDS sutures.  The skin was closed with deep dermal 3-0 Monocryl and subcuticular 4-0 Monocryl sutures.  The nipple and skin flaps had good capillary refill at the end of the procedure.    Left side: Preoperative markings were confirmed.  Incision lines were injected with local containing epinephrine .  After waiting for vasoconstriction, the marked lines were incised with a #15 blade.  A Wise-pattern superomedial breast reduction was performed by de-epithelializing the pedicle, using bovie to create the superomedial pedicle, and removing breast tissue from the lateral and inferior portions of the breast.  Care was taken to not undermine the breast pedicle. Hemostasis was achieved. Experel and myriad were placed in the pocket.  The nipple was gently rotated into position and the soft tissue was closed with 4-0 Monocryl.  The patient was sat upright and size and shape symmetry was confirmed.  The pocket was irrigated and hemostasis confirmed.  The deep tissues were approximated with 3-0 PDS sutures. The skin was closed with deep dermal 3-0 Monocryl and subcuticular 4-0 Monocryl sutures.  Dermabond was applied.  A breast binder and ABDs were placed.  The nipple and skin flaps had good capillary refill at the end of the procedure.  The patient tolerated the procedure well. The patient was allowed to wake from anesthesia and taken to the recovery room in satisfactory condition.  The advanced practice practitioner (APP) assisted throughout the case.  The APP was essential in retraction and counter traction when needed to make the case progress smoothly.  This retraction and assistance  made it possible to see the tissue plans for the procedure.  The assistance was needed for blood control, tissue re-approximation and assisted with closure of the incision site.

## 2024-02-04 NOTE — Anesthesia Procedure Notes (Signed)
 Procedure Name: Intubation Date/Time: 02/04/2024 12:19 PM  Performed by: Frost Kayla MATSU, CRNAPre-anesthesia Checklist: Patient identified, Emergency Drugs available, Suction available and Patient being monitored Patient Re-evaluated:Patient Re-evaluated prior to induction Oxygen Delivery Method: Circle system utilized Preoxygenation: Pre-oxygenation with 100% oxygen Induction Type: IV induction Ventilation: Mask ventilation without difficulty Laryngoscope Size: Miller and 3 Grade View: Grade I Tube type: Oral Tube size: 7.0 mm Number of attempts: 1 Airway Equipment and Method: Stylet and Oral airway Placement Confirmation: ETT inserted through vocal cords under direct vision, positive ETCO2 and breath sounds checked- equal and bilateral Secured at: 20 cm Tube secured with: Tape Dental Injury: Teeth and Oropharynx as per pre-operative assessment

## 2024-02-04 NOTE — Anesthesia Postprocedure Evaluation (Signed)
 Anesthesia Post Note  Patient: Helen Paul  Procedure(s) Performed: BREAST REDUCTION WITH LIPOSUCTION (Bilateral: Breast)     Patient location during evaluation: PACU Anesthesia Type: General Level of consciousness: awake and alert Pain management: pain level controlled Vital Signs Assessment: post-procedure vital signs reviewed and stable Respiratory status: spontaneous breathing, nonlabored ventilation, respiratory function stable and patient connected to nasal cannula oxygen Cardiovascular status: blood pressure returned to baseline and stable Postop Assessment: no apparent nausea or vomiting Anesthetic complications: no   No notable events documented.  Last Vitals:  Vitals:   02/04/24 1530 02/04/24 1545  BP: 97/65 102/74  Pulse: (!) 57 (!) 51  Resp: 11 10  Temp:    SpO2: 97% 99%    Last Pain:  Vitals:   02/04/24 1536  PainSc: 5                  [name]

## 2024-02-04 NOTE — Transfer of Care (Signed)
 Immediate Anesthesia Transfer of Care Note  Patient: Helen Paul  Procedure(s) Performed: BREAST REDUCTION WITH LIPOSUCTION (Bilateral: Breast)  Patient Location: PACU  Anesthesia Type:General  Level of Consciousness: drowsy, patient cooperative, and responds to stimulation  Airway & Oxygen Therapy: Patient Spontanous Breathing and Patient connected to face mask oxygen  Post-op Assessment: Report given to RN and Post -op Vital signs reviewed and stable  Post vital signs: Reviewed and stable  Last Vitals:  Vitals Value Taken Time  BP    Temp    Pulse    Resp    SpO2      Last Pain:  Vitals:   02/04/24 1136  PainSc: 0-No pain      Patients Stated Pain Goal: 4 (02/04/24 1136)  Complications: No notable events documented.

## 2024-02-04 NOTE — Interval H&P Note (Signed)
 History and Physical Interval Note:  02/04/2024 11:22 AM  Helen Paul  has presented today for surgery, with the diagnosis of Hypertrophy of breast.  The various methods of treatment have been discussed with the patient and family. After consideration of risks, benefits and other options for treatment, the patient has consented to  Procedure(s): BREAST REDUCTION WITH LIPOSUCTION (Bilateral) as a surgical intervention.  The patient's history has been reviewed, patient examined, no change in status, stable for surgery.  I have reviewed the patient's chart and labs.  Questions were answered to the patient's satisfaction.     Estefana RAMAN [name]

## 2024-02-04 NOTE — Interval H&P Note (Signed)
 History and Physical Interval Note:  02/04/2024 11:21 AM  Helen Paul  has presented today for surgery, with the diagnosis of Hypertrophy of breast.  The various methods of treatment have been discussed with the patient and family. After consideration of risks, benefits and other options for treatment, the patient has consented to  Procedure(s): BREAST REDUCTION WITH LIPOSUCTION (Bilateral) as a surgical intervention.  The patient's history has been reviewed, patient examined, no change in status, stable for surgery.  I have reviewed the patient's chart and labs.  Questions were answered to the patient's satisfaction.     Helen Paul

## 2024-02-04 NOTE — Discharge Instructions (Addendum)
 Post Anesthesia Home Care Instructions  Activity: Get plenty of rest for the remainder of the day. A responsible individual must stay with you for 24 hours following the procedure.  For the next 24 hours, DO NOT: -Drive a car -Advertising copywriter -Drink alcoholic beverages -Take any medication unless instructed by your physician -Make any legal decisions or sign important papers.  Meals: Start with liquid foods such as gelatin or soup. Progress to regular foods as tolerated. Avoid greasy, spicy, heavy foods. If nausea and/or vomiting occur, drink only clear liquids until the nausea and/or vomiting subsides. Call your physician if vomiting continues.  Special Instructions/Symptoms: Your throat may feel dry or sore from the anesthesia or the breathing tube placed in your throat during surgery. If this causes discomfort, gargle with warm salt water. The discomfort should disappear within 24 hours.  If you had a scopolamine patch placed behind your ear for the management of post- operative nausea and/or vomiting:  1. The medication in the patch is effective for 72 hours, after which it should be removed.  Wrap patch in a tissue and discard in the trash. Wash hands thoroughly with soap and water. 2. You may remove the patch earlier than 72 hours if you experience unpleasant side effects which may include dry mouth, dizziness or visual disturbances. 3. Avoid touching the patch. Wash your hands with soap and water after contact with the patch.   Information for Discharge Teaching: EXPAREL  (bupivacaine  liposome injectable suspension)   Pain relief is important to your recovery. The goal is to control your pain so you can move easier and return to your normal activities as soon as possible after your procedure. Your physician may use several types of medicines to manage pain, swelling, and more.  Your surgeon or anesthesiologist gave you EXPAREL (bupivacaine ) to help control your pain after  surgery.  EXPAREL  is a local anesthetic designed to release slowly over an extended period of time to provide pain relief by numbing the tissue around the surgical site. EXPAREL  is designed to release pain medication over time and can control pain for up to 72 hours. Depending on how you respond to EXPAREL , you may require less pain medication during your recovery. EXPAREL  can help reduce or eliminate the need for opioids during the first few days after surgery when pain relief is needed the most. EXPAREL  is not an opioid and is not addictive. It does not cause sleepiness or sedation.   Important! A teal colored band has been placed on your arm with the date, time and amount of EXPAREL  you have received. Please leave this armband in place for the full 96 hours following administration, and then you may remove the band. If you return to the hospital for any reason within 96 hours following the administration of EXPAREL , the armband provides important information that your health care providers to know, and alerts them that you have received this anesthetic.    Possible side effects of EXPAREL : Temporary loss of sensation or ability to move in the area where medication was injected. Nausea, vomiting, constipation Rarely, numbness and tingling in your mouth or lips, lightheadedness, or anxiety may occur. Call your doctor right away if you think you may be experiencing any of these sensations, or if you have other questions regarding possible side effects.  Follow all other discharge instructions given to you by your surgeon or nurse. Eat a healthy diet and drink plenty of water or other fluids.  INSTRUCTIONS FOR AFTER BREAST SURGERY  You will likely have some questions about what to expect following your operation.  The following information will help you and your family understand what to expect when you are discharged from the hospital.  It is important to follow these guidelines to help ensure a  smooth recovery and reduce complication.  Postoperative instructions include information on: diet, wound care, medications and physical activity.  AFTER SURGERY Expect to go home after the procedure.  In some cases, you may need to spend one night in the hospital for observation.  DIET Breast surgery does not require a specific diet.  However, the healthier you eat the better your body will heal. It is important to increasing your protein intake.  This means limiting the foods with sugar and carbohydrates.  Focus on vegetables and some meat.  If you have liposuction during your procedure be sure to drink water.  If your urine is bright yellow, then it is concentrated, and you need to drink more water.  As a general rule after surgery, you should have 8 ounces of water every hour while awake.  If you find you are persistently nauseated or unable to take in liquids let us  know.  NO TOBACCO USE or EXPOSURE.  This will slow your healing process and lead to a wound.  WOUND CARE Leave the binder on for 3 days . Use fragrance free soap like Dial, Dove or [place].   After 3 days you can remove the binder to shower. Once dry apply binder or sports bra. If you have liposuction you will have a soft and spongy dressing (Lipofoam) that helps prevent creases in your skin.  Remove before you shower and then replace it.  It is also available on [employer]. If you have steri-strips / tape directly attached to your skin leave them in place. It is OK to get these wet.   No baths, pools or hot tubs for four weeks. We close your incision to leave the smallest and best-looking scar. No ointment or creams on your incisions for four weeks.  No Neosporin (Too many skin reactions).  A few weeks after surgery you can use Mederma and start massaging the scar. We ask you to wear your binder or sports bra for the first 6 weeks around the clock, including while sleeping. This provides added comfort and helps reduce the fluid accumulation  at the surgery site. NO Ice or heating pads to the operative site.  You have a very high risk of a BURN before you feel the temperature change.  ACTIVITY No heavy lifting until cleared by the doctor.  This usually means no more than a half-gallon of milk.  It is OK to walk and climb stairs. Moving your legs is very important to decrease your risk of a blood clot.  It will also help keep you from getting deconditioned.  Every 1 to 2 hours get up and walk for 5 minutes. This will help with a quicker recovery back to normal.  Let pain be your guide so you don't do too much.  This time is for you to recover.  You will be more comfortable if you sleep and rest with your head elevated either with a few pillows under you or in a recliner.  No stomach sleeping for a three months.  WORK Everyone returns to work at different times. As a rough guide, most people take at least 1 - 2 weeks off prior to returning to work. If you need documentation for your job,  give the forms to the front staff at the clinic.  DRIVING Arrange for someone to bring you home from the hospital after your surgery.  You may be able to drive a few days after surgery but not while taking any narcotics or valium.  BOWEL MOVEMENTS Constipation can occur after anesthesia and while taking pain medication.  It is important to stay ahead for your comfort.  We recommend taking Milk of Magnesia (2 tablespoons; twice a day) while taking the pain pills.  MEDICATIONS You may be prescribed should start after surgery At your preoperative visit for you history and physical you may have been given the following medications: An antibiotic: Start this medication when you get home and take according to the instructions on the bottle. Zofran  4 mg:  This is to treat nausea and vomiting.  You can take this every 6 hours as needed and only if needed. Valium 2 mg for breast cancer patients: This is for muscle tightness if you have an implant or expander.  This will help relax your muscle which also helps with pain control.  This can be taken every 12 hours as needed. Don't drive after taking this medication. Norco (hydrocodone/acetaminophen ) 5/325 mg:  This is only to be used after you have taken the Motrin  or the Tylenol . Every 8 hours as needed.   Over the counter Medication to take: Ibuprofen  (Motrin ) 600 mg:  Take this every 6 hours.  If you have additional pain then take 500 mg of the Tylenol  every 8 hours.  Only take the Norco after you have tried these two. MiraLAX or Milk of Magnesia: Take this according to the bottle if you take the Norco.  WHEN TO CALL Call your surgeon's office if any of the following occur: Fever 101 degrees F or greater Excessive bleeding or fluid from the incision site. Pain that increases over time without aid from the medications Redness, warmth, or pus draining from incision sites Persistent nausea or inability to take in liquids Severe misshapen area that underwent the operation.

## 2024-02-05 ENCOUNTER — Encounter: Payer: Self-pay | Admitting: Plastic Surgery

## 2024-02-05 ENCOUNTER — Encounter: Admitting: Plastic Surgery

## 2024-02-05 ENCOUNTER — Encounter (HOSPITAL_BASED_OUTPATIENT_CLINIC_OR_DEPARTMENT_OTHER): Payer: Self-pay | Admitting: Plastic Surgery

## 2024-02-08 LAB — SURGICAL PATHOLOGY

## 2024-02-15 ENCOUNTER — Encounter: Admitting: Student

## 2024-02-15 ENCOUNTER — Ambulatory Visit: Admitting: Student

## 2024-02-15 DIAGNOSIS — Z9889 Other specified postprocedural states: Secondary | ICD-10-CM

## 2024-02-15 NOTE — Progress Notes (Signed)
 Patient is a 48 year old female who recently underwent bilateral breast reduction with Dr. Lowery on 02/04/2024.  She has about 1-1/2 weeks postop.  She presents the clinic today for postoperative follow-up.  Today, patient reports with her husband at bedside.  She states that overall she has been doing pretty well.  She states that she has been itchy bilaterally.  She denies any fevers or chills.  She denies any shortness of breath, difficulty breathing or sensation of her throat closing.  She reports that she did try to take Benadryl , but still felt she was itching.  She does state that she takes Zyrtec every day.  Patient reports her pain has been controlled with Tylenol  and a little bit of ibuprofen .  She reports she has been up and ambulating.  She reports she has been eating and drinking without any issue.  Chaperone present on exam.  On exam, patient is sitting upright in no acute distress.  Breasts are overall soft and symmetric.  There is no overlying erythema.  There is a little bit of mild ecchymosis noted bilaterally.  NAC's appear to be healthy bilaterally.  There does appear to be some mild irritation surrounding the NAC's.  Steri-Strips are in place over the incisions.  These were removed.  There appears to be irritation along all of the incisions.  There is no drainage noted on exam.    I discussed with the patient I suspect that she is having a reaction to the Dermabond given pattern of the rash.  I recommended that she apply Vaseline once a day to help work off Dermabond.  I discussed with her that she may use Benadryl  ointment or hydrocortisone cream to the skin, but to not use them on the incisions.  I discussed with the patient she may continue taking antihistamines such as Zyrtec during the day and Benadryl  at night.  Patient expressed understanding.  I discussed with the patient that if she develops any shortness of breath, difficulty breathing or sensation that her throat is  closing, she needs to go to the emergency room.  I discussed with her otherwise to closely monitor her rash.  I discussed with her that if she feels it is worsening she needs to call us .  Patient expressed understanding.  Discussed with the patient to continue with compression at all times  Recommended that patient follow back up next week  I instructed her to call in the meantime she has any questions or concerns about anything.

## 2024-02-17 ENCOUNTER — Encounter: Payer: Self-pay | Admitting: Student

## 2024-02-17 ENCOUNTER — Encounter: Payer: Self-pay | Admitting: Plastic Surgery

## 2024-02-22 ENCOUNTER — Ambulatory Visit (INDEPENDENT_AMBULATORY_CARE_PROVIDER_SITE_OTHER): Admitting: Surgical

## 2024-02-22 DIAGNOSIS — Z9889 Other specified postprocedural states: Secondary | ICD-10-CM

## 2024-02-22 NOTE — Progress Notes (Signed)
 Patient is a 48 y.o.-year-old female status post bilateral breast reduction with Dr.  Lowery. Patient is 2.5 weeks postop.  Patient reports she is overall doing well, she did have a rash after surgery which is continuing to improve.  She has been using some hydrocortisone cream on her bilateral breasts, but avoiding the incisions.  This has significantly helped.  Chaperone present on exam Patient is well-developed, well-nourished, no acute distress.  Breathing is unlabored. Bilateral NAC's are viable, bilateral breast incisions are intact. There is no erythema or cellulitic changes noted. No obvious subcutaneous fluid collections noted with palpation. She does have small wounds noted of the left breast at the T-junction and lateral breast incision along the inframammary fold.  These are about 3 x 3 mm without any surrounding erythema or cellulitic changes.  A/P: No signs of infection or concern on exam.  Continue with compressive garments.  Recommend decreasing use of hydrocortisone cream.  Recommend Vaseline and gauze to bilateral breast wound incisions.  Recommend continuing with compressive garment 24/7 until 6 weeks post-op,  avoiding strenuous activity/heavy lifting until 6 weeks post-op  Patient is scheduled for follow-up in about 11 days.  All of the patient's questions were answered to their content. Recommend calling with any questions or concerns.  Pictures were obtained of the patient and placed in the chart with the patient's or guardian's permission.

## 2024-02-26 ENCOUNTER — Encounter: Admitting: Plastic Surgery

## 2024-02-29 NOTE — Progress Notes (Unsigned)
 Patient is a pleasant 47 year old female s/p bilateral breast reduction with liposuction performed 02/04/2024 Dr. Lowery who presents to clinic for postoperative follow-up.  She was just seen here in clinic on 02/22/2024.  At that time, small wounds were noted on the left breast at the inferior T-zone and lateral inframammary incision.  No evidence concerning for infection.  Recommended continued activity modifications and compressive garments.  Follow-up as scheduled.  Today she is doing well.  She is concerned about the wound underneath her left breast, but otherwise is doing well from postoperative standpoint.  On exam, breasts have excellent shape and symmetry.  Soft throughout.  Incisions are well-healed with the exception of a superficial wound 0.75 cm superior T zone left breast with overlying hardened exudate.  No surrounding erythema.  She also has a 1 cm wound at inferior T-zone left breast that has a mild amount of residual slough.  Approximately 0.5 cm depth.  No surrounding erythema.  The superficial superior T zone wound will likely heal in the next 1 to 2 weeks.  The wound at the bottom will likely continue to drain periodically until the slough has fully liquefied.  No evidence concerning for infection.  Recommending Vashe soaked gauze and then Vaseline and bordered Mepilex dressing.  She cannot do other bandages due to skin sensitivity.  Follow-up in 2 weeks, sooner if needed.  Overall, her exam today looks good.  Repeat photos at next visit.

## 2024-03-01 ENCOUNTER — Ambulatory Visit (INDEPENDENT_AMBULATORY_CARE_PROVIDER_SITE_OTHER): Admitting: Physician Assistant

## 2024-03-01 DIAGNOSIS — Z9889 Other specified postprocedural states: Secondary | ICD-10-CM

## 2024-03-04 ENCOUNTER — Encounter: Admitting: Student

## 2024-03-08 ENCOUNTER — Encounter: Payer: Self-pay | Admitting: Family Medicine

## 2024-03-15 NOTE — Progress Notes (Unsigned)
 Patient is a 48 year old female who recently underwent bilateral breast reduction with Dr. Lowery on 02/04/2024.  Patient is almost 6 weeks postop.  She presents to the clinic today for postoperative follow-up.   Patient was last seen in clinic on 03/01/2024.  At this visit, patient was doing well.  On exam, her breast had excellent shape and symmetry.  They were soft throughout.  Incisions were well-healed with the exception of a superficial wound to the superior T-zone of the left breast with overlying hardened exudate.  There was also a 1 cm wound at the inferior T-zone of the left breast that had a mild amount of residual slough.  Recommended that patient apply Vashe soaked gauze and then Vaseline to the wounds.  Today,

## 2024-03-16 ENCOUNTER — Ambulatory Visit (INDEPENDENT_AMBULATORY_CARE_PROVIDER_SITE_OTHER): Admitting: Student

## 2024-03-16 VITALS — BP 114/79 | HR 85

## 2024-03-16 DIAGNOSIS — Z9889 Other specified postprocedural states: Secondary | ICD-10-CM

## 2024-03-16 DIAGNOSIS — Z719 Counseling, unspecified: Secondary | ICD-10-CM

## 2024-04-12 ENCOUNTER — Encounter: Payer: Self-pay | Admitting: Student

## 2024-04-12 ENCOUNTER — Ambulatory Visit: Admitting: Student

## 2024-04-12 VITALS — BP 131/86 | HR 85 | Ht 65.0 in | Wt 214.8 lb

## 2024-04-12 DIAGNOSIS — Z9889 Other specified postprocedural states: Secondary | ICD-10-CM

## 2024-04-12 NOTE — Progress Notes (Signed)
 Patient is a 48 year old female who recently underwent bilateral breast reduction with Dr. Lowery on 02/04/2024. She is a little over 2 months postop. She presents to the clinic today for postoperative follow up.        Patient was last seen in the clinic on 03/16/2024.  At this visit, patient was overall doing well.  On exam, breasts are soft and symmetric.  There is a little bit of firmness noted to the lateral breast bilaterally consistent with scar tissue.  NAC's were healthy.  Incisions were overall healing well.  There is a small superficial wound noted through the left inferior T-zone.    Today, patient reports he is doing well.  She states that she does have some pigmentation noted to the left lateral inframammary incision.  She states that she has been applying scar creams.  She denies any other issues or concerns at this time.  Does not report any infectious symptoms.  Chaperone present on exam.  On exam, patient is sitting upright in no acute distress.  Breasts are overall soft and symmetric.  There is still some firmness out laterally consistent with scar tissue.  There are no overlying skin changes.  There is no overlying erythema.  No obvious fluid collections on exam.  NAC's are healthy.  Incisions overall appear to be well-healed, scar to the left inframammary incision does appear to be a little bit darker.  There are no signs of infection on exam.  Recommended that patient continue with scar creams and scar massage daily.  Discussed with her to continue to monitor her scars.  Patient expressed understanding.  Recommended that she continue to massage her breasts/to the areas of firmness.  I discussed with her that if they do not soften up in several months, she should reach back out.  I discussed with her that if the firmness becomes any bigger she should definitely let us  know.  Patient expressed understanding.  Patient to follow-up as needed.  I instructed her to call if she has  any questions or concerns about anything.  Pictures were obtained of the patient and placed in the chart with the patient's or guardian's permission.

## 2024-07-18 ENCOUNTER — Encounter: Payer: Self-pay | Admitting: Family Medicine

## 2024-07-18 ENCOUNTER — Ambulatory Visit: Admitting: Family Medicine

## 2024-07-18 VITALS — BP 108/68 | HR 76 | Temp 97.9°F | Wt 215.0 lb

## 2024-07-18 DIAGNOSIS — R202 Paresthesia of skin: Secondary | ICD-10-CM

## 2024-07-18 DIAGNOSIS — Z23 Encounter for immunization: Secondary | ICD-10-CM | POA: Diagnosis not present

## 2024-07-18 DIAGNOSIS — R200 Anesthesia of skin: Secondary | ICD-10-CM

## 2024-07-18 NOTE — Progress Notes (Signed)
" ° °  Subjective:    Patient ID: Helen Paul, female    DOB: [birth year], 49 y.o.   MRN: [id number]  HPI Here asking about possible carpal tunnel symptoms in both arms and hands. This started on the right side about a year ago when she would occasionally wake up with her right arm feeling asleep. After several minutes this would resolve. However over the last 3 months this has become a daily issue. She feels painful pins and needles sensations all down the right arm and into the thumb and 2nd-4th fingers. Sometimes the arm feels weak. She has also begun to experience the same symptoms in the left arm and hand, though to a lesser degree. No lower extremity symptoms.    Review of Systems  Respiratory: Negative.    Cardiovascular: Negative.   Neurological:  Positive for weakness and numbness.       Objective:   Physical Exam Constitutional:      Appearance: Normal appearance.  Cardiovascular:     Rate and Rhythm: Normal rate and regular rhythm.     Pulses: Normal pulses.     Heart sounds: Normal heart sounds.  Pulmonary:     Effort: Pulmonary effort is normal.     Breath sounds: Normal breath sounds.  Musculoskeletal:     Comments: Her neck and upper back are normal with no tenderness and full ROM  Neurological:     Mental Status: She is alert and oriented to person, place, and time.     Comments: Normal sensation and strength in both upper extremities            Assessment & Plan:  She has neurologic deficits in both upper extremities. We will set her up for nerve conduction studies on both upper extremities to investigate. Garnette Olmsted, MD    "

## 2024-07-18 NOTE — Addendum Note (Signed)
 Addended by: LADONNA INOCENTE SAILOR on: 07/18/2024 10:43 AM   Modules accepted: Orders

## 2024-07-21 ENCOUNTER — Other Ambulatory Visit: Payer: Self-pay

## 2024-07-21 ENCOUNTER — Encounter: Payer: Self-pay | Admitting: Neurology

## 2024-07-21 DIAGNOSIS — R202 Paresthesia of skin: Secondary | ICD-10-CM

## 2024-08-19 ENCOUNTER — Ambulatory Visit

## 2024-09-01 ENCOUNTER — Encounter: Payer: Self-pay | Admitting: Neurology
